# Patient Record
Sex: Female | Born: 1935 | Race: White | Hispanic: No | Smoking: Never smoker
Health system: Southern US, Community
[De-identification: ages and names within clinical notes are randomized; demographics above are authoritative.]

## PROBLEM LIST (undated history)

## (undated) DIAGNOSIS — I10 Essential (primary) hypertension: Secondary | ICD-10-CM

## (undated) DIAGNOSIS — I509 Heart failure, unspecified: Secondary | ICD-10-CM

## (undated) DIAGNOSIS — K219 Gastro-esophageal reflux disease without esophagitis: Secondary | ICD-10-CM

## (undated) DIAGNOSIS — R1310 Dysphagia, unspecified: Secondary | ICD-10-CM

## (undated) DIAGNOSIS — Z8719 Personal history of other diseases of the digestive system: Secondary | ICD-10-CM

## (undated) DIAGNOSIS — E119 Type 2 diabetes mellitus without complications: Secondary | ICD-10-CM

## (undated) DIAGNOSIS — G4730 Sleep apnea, unspecified: Secondary | ICD-10-CM

## (undated) HISTORY — PX: APPENDECTOMY: SHX54

## (undated) HISTORY — PX: CHOLECYSTECTOMY: SHX55

---

## 2005-01-16 ENCOUNTER — Ambulatory Visit: Payer: Self-pay | Admitting: Internal Medicine

## 2005-01-29 ENCOUNTER — Ambulatory Visit: Payer: Self-pay | Admitting: Internal Medicine

## 2005-02-20 ENCOUNTER — Ambulatory Visit: Payer: Self-pay | Admitting: Internal Medicine

## 2005-04-17 ENCOUNTER — Ambulatory Visit: Payer: Self-pay | Admitting: Internal Medicine

## 2006-01-10 ENCOUNTER — Emergency Department: Payer: Self-pay | Admitting: Emergency Medicine

## 2006-01-12 ENCOUNTER — Emergency Department: Payer: Self-pay | Admitting: Emergency Medicine

## 2006-01-15 ENCOUNTER — Emergency Department: Payer: Self-pay | Admitting: Emergency Medicine

## 2006-01-22 ENCOUNTER — Emergency Department: Payer: Self-pay | Admitting: Emergency Medicine

## 2006-03-26 ENCOUNTER — Ambulatory Visit: Payer: Self-pay | Admitting: Internal Medicine

## 2006-07-11 ENCOUNTER — Ambulatory Visit: Payer: Self-pay | Admitting: Internal Medicine

## 2006-12-09 ENCOUNTER — Emergency Department: Payer: Self-pay | Admitting: Emergency Medicine

## 2007-05-15 ENCOUNTER — Ambulatory Visit: Payer: Self-pay | Admitting: Gastroenterology

## 2007-06-18 ENCOUNTER — Ambulatory Visit: Payer: Self-pay | Admitting: Internal Medicine

## 2008-06-21 ENCOUNTER — Ambulatory Visit: Payer: Self-pay | Admitting: Internal Medicine

## 2008-10-06 ENCOUNTER — Ambulatory Visit: Payer: Self-pay

## 2009-07-29 ENCOUNTER — Ambulatory Visit: Payer: Self-pay | Admitting: Internal Medicine

## 2009-10-25 ENCOUNTER — Ambulatory Visit: Payer: Self-pay | Admitting: Internal Medicine

## 2009-11-08 ENCOUNTER — Ambulatory Visit: Payer: Self-pay | Admitting: Internal Medicine

## 2010-04-18 ENCOUNTER — Ambulatory Visit: Payer: Self-pay | Admitting: Ophthalmology

## 2010-04-25 ENCOUNTER — Ambulatory Visit: Payer: Self-pay | Admitting: Ophthalmology

## 2010-08-02 ENCOUNTER — Ambulatory Visit: Payer: Self-pay | Admitting: Internal Medicine

## 2011-06-11 ENCOUNTER — Ambulatory Visit: Payer: Self-pay | Admitting: Ophthalmology

## 2011-06-11 DIAGNOSIS — Z01810 Encounter for preprocedural cardiovascular examination: Secondary | ICD-10-CM

## 2011-06-18 ENCOUNTER — Ambulatory Visit: Payer: Self-pay | Admitting: Ophthalmology

## 2011-10-23 ENCOUNTER — Ambulatory Visit: Payer: Self-pay | Admitting: Internal Medicine

## 2011-10-25 ENCOUNTER — Ambulatory Visit: Payer: Self-pay | Admitting: Internal Medicine

## 2012-05-14 ENCOUNTER — Ambulatory Visit: Payer: Self-pay | Admitting: Ophthalmology

## 2012-05-14 LAB — CREATININE, SERUM
EGFR (African American): >60
EGFR (Non-African Amer.): >60

## 2012-09-02 ENCOUNTER — Ambulatory Visit: Payer: Self-pay | Admitting: Neurosurgery

## 2012-09-02 LAB — CREATININE, SERUM
EGFR (African American): >60
EGFR (Non-African Amer.): >60

## 2012-12-25 ENCOUNTER — Ambulatory Visit: Payer: Self-pay | Admitting: Internal Medicine

## 2012-12-26 ENCOUNTER — Emergency Department: Payer: Self-pay | Admitting: Emergency Medicine

## 2012-12-26 LAB — CBC WITH DIFFERENTIAL/PLATELET
Eosinophil %: 0.5 %
HCT: 41.8 % (ref 35.0–47.0)
Lymphocyte %: 24 %
MCH: 29.8 pg (ref 26.0–34.0)
MCHC: 34.2 g/dL (ref 32.0–36.0)
Monocyte %: 7.8 %
Neutrophil #: 8.3 10*3/uL — ABNORMAL HIGH (ref 1.4–6.5)
Neutrophil %: 67.3 %
RBC: 4.79 10*6/uL (ref 3.80–5.20)
RDW: 14.7 % — ABNORMAL HIGH (ref 11.5–14.5)
WBC: 12.3 10*3/uL — ABNORMAL HIGH (ref 3.6–11.0)

## 2012-12-26 LAB — URINALYSIS, COMPLETE
Bacteria: NONE SEEN
Bilirubin,UR: NEGATIVE
Blood: NEGATIVE
Glucose,UR: NEGATIVE mg/dL (ref 0–75)
Nitrite: NEGATIVE
Protein: NEGATIVE
Specific Gravity: 1.01 (ref 1.003–1.030)
Squamous Epithelial: 1
WBC UR: 58 /HPF (ref 0–5)

## 2012-12-26 LAB — COMPREHENSIVE METABOLIC PANEL
Albumin: 3.3 g/dL — ABNORMAL LOW (ref 3.4–5.0)
Alkaline Phosphatase: 92 U/L (ref 50–136)
Anion Gap: 2 — ABNORMAL LOW (ref 7–16)
Calcium, Total: 8.6 mg/dL (ref 8.5–10.1)
Creatinine: 0.68 mg/dL (ref 0.60–1.30)
EGFR (African American): >60
Glucose: 155 mg/dL — ABNORMAL HIGH (ref 65–99)
Osmolality: 274 (ref 275–301)
Potassium: 4.2 mmol/L (ref 3.5–5.1)
SGOT(AST): 25 U/L (ref 15–37)
Sodium: 135 mmol/L — ABNORMAL LOW (ref 136–145)
Total Protein: 6.9 g/dL (ref 6.4–8.2)

## 2012-12-26 LAB — TROPONIN I: Troponin-I: <0.02 ng/mL

## 2013-12-30 ENCOUNTER — Ambulatory Visit: Payer: Self-pay | Admitting: Internal Medicine

## 2014-09-09 ENCOUNTER — Ambulatory Visit: Payer: Self-pay | Admitting: Internal Medicine

## 2015-01-05 ENCOUNTER — Ambulatory Visit
Admit: 2015-01-05 | Disposition: A | Discharge status: Home / Self Care | Payer: Self-pay | Attending: Internal Medicine | Admitting: Internal Medicine

## 2016-05-30 ENCOUNTER — Other Ambulatory Visit: Payer: Self-pay | Admitting: Internal Medicine

## 2016-05-30 DIAGNOSIS — Z1231 Encounter for screening mammogram for malignant neoplasm of breast: Secondary | ICD-10-CM

## 2016-06-15 ENCOUNTER — Ambulatory Visit: Payer: Self-pay

## 2016-07-05 ENCOUNTER — Ambulatory Visit
Admission: RE | Admit: 2016-07-05 | Discharge: 2016-07-05 | Disposition: A | Discharge status: Home / Self Care | Payer: Medicare Other | Source: Ambulatory Visit | Attending: Internal Medicine | Admitting: Internal Medicine

## 2016-07-05 ENCOUNTER — Other Ambulatory Visit: Payer: Self-pay | Admitting: Internal Medicine

## 2016-07-05 DIAGNOSIS — Z1231 Encounter for screening mammogram for malignant neoplasm of breast: Secondary | ICD-10-CM | POA: Insufficient documentation

## 2016-10-01 HISTORY — PX: CARDIAC VALVE REPLACEMENT: SHX585

## 2017-07-05 ENCOUNTER — Emergency Department: Payer: Medicare Other

## 2017-07-05 ENCOUNTER — Emergency Department
Admission: EM | Admit: 2017-07-05 | Discharge: 2017-07-06 | Disposition: A | Discharge status: Other Acute Inpatient Hospital | Payer: Medicare Other | Attending: Emergency Medicine | Admitting: Emergency Medicine

## 2017-07-05 DIAGNOSIS — R109 Unspecified abdominal pain: Secondary | ICD-10-CM | POA: Diagnosis present

## 2017-07-05 DIAGNOSIS — I110 Hypertensive heart disease with heart failure: Secondary | ICD-10-CM | POA: Diagnosis not present

## 2017-07-05 DIAGNOSIS — R824 Acetonuria: Secondary | ICD-10-CM | POA: Diagnosis not present

## 2017-07-05 DIAGNOSIS — I509 Heart failure, unspecified: Secondary | ICD-10-CM | POA: Insufficient documentation

## 2017-07-05 DIAGNOSIS — R319 Hematuria, unspecified: Secondary | ICD-10-CM | POA: Diagnosis not present

## 2017-07-05 DIAGNOSIS — E119 Type 2 diabetes mellitus without complications: Secondary | ICD-10-CM | POA: Diagnosis not present

## 2017-07-05 DIAGNOSIS — Z79899 Other long term (current) drug therapy: Secondary | ICD-10-CM | POA: Insufficient documentation

## 2017-07-05 DIAGNOSIS — K449 Diaphragmatic hernia without obstruction or gangrene: Secondary | ICD-10-CM | POA: Diagnosis not present

## 2017-07-05 DIAGNOSIS — Z7982 Long term (current) use of aspirin: Secondary | ICD-10-CM | POA: Insufficient documentation

## 2017-07-05 DIAGNOSIS — Z7984 Long term (current) use of oral hypoglycemic drugs: Secondary | ICD-10-CM | POA: Insufficient documentation

## 2017-07-05 DIAGNOSIS — K311 Adult hypertrophic pyloric stenosis: Secondary | ICD-10-CM | POA: Diagnosis not present

## 2017-07-05 HISTORY — DX: Gastro-esophageal reflux disease without esophagitis: K21.9

## 2017-07-05 HISTORY — DX: Essential (primary) hypertension: I10

## 2017-07-05 HISTORY — DX: Type 2 diabetes mellitus without complications: E11.9

## 2017-07-05 HISTORY — DX: Dysphagia, unspecified: R13.10

## 2017-07-05 HISTORY — DX: Heart failure, unspecified: I50.9

## 2017-07-05 LAB — URINALYSIS, COMPLETE (UACMP) WITH MICROSCOPIC
Bilirubin Urine: NEGATIVE
Glucose, UA: NEGATIVE mg/dL
HGB URINE DIPSTICK: NEGATIVE
Ketones, ur: 20 mg/dL — AB
NITRITE: NEGATIVE
Protein, ur: 100 mg/dL — AB
SPECIFIC GRAVITY, URINE: 1.029 (ref 1.005–1.030)
pH: 5 (ref 5.0–8.0)

## 2017-07-05 LAB — BASIC METABOLIC PANEL
ANION GAP: 11 (ref 5–15)
BUN: 21 mg/dL — ABNORMAL HIGH (ref 6–20)
CALCIUM: 9.2 mg/dL (ref 8.9–10.3)
CO2: 25 mmol/L (ref 22–32)
Chloride: 101 mmol/L (ref 101–111)
Creatinine, Ser: 0.68 mg/dL (ref 0.44–1.00)
Glucose, Bld: 191 mg/dL — ABNORMAL HIGH (ref 65–99)
Potassium: 4.6 mmol/L (ref 3.5–5.1)
SODIUM: 137 mmol/L (ref 135–145)

## 2017-07-05 LAB — CBC
HCT: 40.7 % (ref 35.0–47.0)
HEMOGLOBIN: 13.6 g/dL (ref 12.0–16.0)
MCH: 28.7 pg (ref 26.0–34.0)
MCHC: 33.5 g/dL (ref 32.0–36.0)
MCV: 85.7 fL (ref 80.0–100.0)
Platelets: 258 10*3/uL (ref 150–440)
RBC: 4.75 MIL/uL (ref 3.80–5.20)
RDW: 15.2 % — ABNORMAL HIGH (ref 11.5–14.5)
WBC: 12 10*3/uL — ABNORMAL HIGH (ref 3.6–11.0)

## 2017-07-05 LAB — LIPASE, BLOOD: LIPASE: 33 U/L (ref 11–51)

## 2017-07-05 LAB — TROPONIN I

## 2017-07-05 NOTE — ED Triage Notes (Signed)
Pt to triage via wheelchair. Pt reports abd pain and chest pain that started around 4 pm. Pt has +nausea and is spitting up but has not vomited. +shortness of breath but denies diaphoresis. Pt reports she ate lunch out and thinks she could have food poison.

## 2017-07-06 ENCOUNTER — Encounter: Payer: Self-pay | Admitting: Emergency Medicine

## 2017-07-06 ENCOUNTER — Emergency Department: Payer: Medicare Other

## 2017-07-06 LAB — HEPATIC FUNCTION PANEL
ALBUMIN: 3.6 g/dL (ref 3.5–5.0)
ALT: 20 U/L (ref 14–54)
AST: 25 U/L (ref 15–41)
Alkaline Phosphatase: 65 U/L (ref 38–126)
Bilirubin, Direct: 0.2 mg/dL (ref 0.1–0.5)
Indirect Bilirubin: 1 mg/dL — ABNORMAL HIGH (ref 0.3–0.9)
TOTAL PROTEIN: 6.6 g/dL (ref 6.5–8.1)
Total Bilirubin: 1.2 mg/dL (ref 0.3–1.2)

## 2017-07-06 LAB — LACTIC ACID, PLASMA: Lactic Acid, Venous: 1.3 mmol/L (ref 0.5–1.9)

## 2017-07-06 LAB — BRAIN NATRIURETIC PEPTIDE: B NATRIURETIC PEPTIDE 5: 102 pg/mL — AB (ref 0.0–100.0)

## 2017-07-06 MED ORDER — SODIUM CHLORIDE 0.9 % IV BOLUS (SEPSIS)
500.0000 mL | INTRAVENOUS | Status: AC
  Administered 2017-07-06: 500 mL via INTRAVENOUS

## 2017-07-06 MED ORDER — MORPHINE SULFATE (PF) 2 MG/ML IV SOLN
2.0000 mg | Freq: Once | INTRAVENOUS | Status: AC
  Administered 2017-07-06: 2 mg via INTRAVENOUS
  Filled 2017-07-06: qty 1

## 2017-07-06 NOTE — ED Notes (Signed)
Pt transported to CT ?

## 2017-07-06 NOTE — ED Notes (Signed)
emtala reviewed by this RN 

## 2017-07-06 NOTE — ED Notes (Signed)
Pt given crackers and diet coke.   

## 2017-07-06 NOTE — ED Notes (Signed)
Pt spit up some crackers. MD Karma Greaser made aware.

## 2017-07-06 NOTE — Discharge Instructions (Signed)
As we discussed, we believe your symptoms are caused by a very large hiatal hernia, where the majority of your stomach is actually up in your chest.  We discussed sending you to [facility] for further evaluation, but because this is a process that has probably been going on for some time and because you are stable and able to eat and drink, your preference is to go home and follow-up as an outpatient.  We think that is appropriate, but strongly encourage you to follow-up with [facility] (we provided contact information) for an appointment with an Upper GI specialist at the next available opportunity.  Please continue your regular medications.  There is a question about whether or not you have an infection in your urine, but since you have been on an antibiotic and are not having any specific symptoms, we are holding off on giving you additional antibiotics.  A urine culture is being processed in the lab, though, and you will be contacted to let you know that you need additional antibiotics.

## 2017-07-06 NOTE — ED Provider Notes (Signed)
[facility] Emergency Department Provider Note  ____________________________________________   First MD Initiated Contact with Patient 07/06/17 [phone]     (approximate)  I have reviewed the triage vital signs and the nursing notes.   HISTORY  Chief Complaint Abdominal Pain and Chest Pain    HPI Kayla Gomez is a 83 y.o. female who presents in the company of her daughters tonight for evaluation of lower abdominal pain as well as some upper abdominal pain.She denies chest pain per se but the pain seems to be at the top of her abdomen as well as the suprapubic region.  She reports some shortness of breath as well.  She has had nausea and some "spitting up", but denies vomiting.  She has been eating and drinking less than usual recently.  Symptoms became acutely worse today, but she reports that she has not been feeling well for several days.  She was seen by her primary care doctor about a week ago and started on Keflex for possible left lower leg cellulitis and she has been on 10 days of cephalexin.  She is also treated with a PPI for dysphagia and acid reflux.  She has choked occasionally on the "stuff" that has been coming up into her mouth particularly at night.  She denies fever/chills, vomiting, and dysuria.  Today her symptoms are moderate.  Nothing in particular makes the patient's symptoms better nor worse.      Past Medical History:  Diagnosis Date  . Acid reflux   . CHF (congestive heart failure) ([place])   . Diabetes mellitus without complication ([place])   . Dysphagia   . Hypertension     There are no active problems to display for this patient.   Past Surgical History:  Procedure Laterality Date  . APPENDECTOMY    . CHOLECYSTECTOMY      Prior to Admission medications   Medication Sig Start Date End Date Taking? Authorizing Provider  aspirin 81 MG EC tablet Take 81 mg by mouth daily.   Yes [provider]  cephALEXin (KEFLEX) 500 MG  capsule Take 500 mg by mouth 3 (three) times daily. 06/27/17 07/07/17 Yes [provider]  glipiZIDE (GLUCOTROL XL) 2.5 MG 24 hr tablet Take 2.5 mg by mouth daily.   Yes [provider]  lisinopril (PRINIVIL,ZESTRIL) 10 MG tablet Take 10 mg by mouth daily.   Yes [provider]  omeprazole (PRILOSEC) 20 MG capsule Take 20 mg by mouth daily.   Yes [provider]  oxybutynin (DITROPAN-XL) 10 MG 24 hr tablet Take 10 mg by mouth daily.   Yes [provider]    Allergies Patient has no known allergies.  History reviewed. No pertinent family history.  Social History Social History  Substance Use Topics  . Smoking status: Never Smoker  . Smokeless tobacco: Never Used  . Alcohol use No    Review of Systems Constitutional: No fever/chills Eyes: No visual changes. ENT: No sore throat. Cardiovascular: Denies chest pain. Respiratory: Recent shortness of breath and cough Gastrointestinal: Upper and lower abdominal pain with nausea but no vomiting, no diarrhea Genitourinary: Negative for dysuria. Musculoskeletal: Negative for neck pain.  Negative for back pain. Integumentary: Negative for rash. Neurological: Negative for headaches, focal weakness or numbness.   ____________________________________________   PHYSICAL EXAM:  VITAL SIGNS: ED Triage Vitals  Enc Vitals Group     BP 07/05/17 2129 (!) 126/108     Pulse Rate 07/05/17 2129 (!) 110  Resp 07/05/17 2129 18     Temp 07/05/17 2129 98.1 F (36.7 C)     Temp Source 07/05/17 2129 Axillary     SpO2 07/05/17 2129 97 %     Weight 07/05/17 2130 71.7 kg (158 lb)     Height 07/05/17 2130 1.575 m (5\' 2" )     Head Circumference --      Peak Flow --      Pain Score 07/05/17 2152 7     Pain Loc --      Pain Edu? --      Excl. in [place]? --     Constitutional: Alert and oriented. Elderly but generally well-appearing and in no acute distress but does appear somewhat uncomfortable Eyes:  Conjunctivae are normal.  Head: Atraumatic. Nose: No congestion/rhinnorhea. Mouth/Throat: Mucous membranes are moist. Neck: No stridor.  No meningeal signs.   Cardiovascular: Initially some mild tachycardia then a normal rate in the 80s, regular rhythm. Good peripheral circulation. Grossly normal heart sounds. Respiratory: Slightly increased respiratory effort.  No retractions.  Some mild coarse breath sounds in the bases of the lungs.  Occasional cough. Gastrointestinal: Soft with no upper abdominal tenderness and with negative Murphy sign but tenderness to palpation of the suprapubic region Musculoskeletal: No gross deformities of extremities. Neurologic:  Normal speech and language. No gross focal neurologic deficits are appreciated.  Skin:  Skin is warm, dry and intact. No rash noted.  There is some chronic skin changes but no evidence of acute cellulitis in the left lower extremity at this time Psychiatric: Mood and affect are normal. Speech and behavior are normal.  ____________________________________________   LABS (all labs ordered are listed, but only abnormal results are displayed)  Labs Reviewed  BASIC METABOLIC PANEL - Abnormal; Notable for the following:       Result Value   Glucose, Bld 191 (*)    BUN 21 (*)    All other components within normal limits  CBC - Abnormal; Notable for the following:    WBC 12.0 (*)    RDW 15.2 (*)    All other components within normal limits  URINALYSIS, COMPLETE (UACMP) WITH MICROSCOPIC - Abnormal; Notable for the following:    Color, Urine AMBER (*)    APPearance CLOUDY (*)    Ketones, ur 20 (*)    Protein, ur 100 (*)    Leukocytes, UA LARGE (*)    Bacteria, UA RARE (*)    Squamous Epithelial / LPF 6-30 (*)    All other components within normal limits  BRAIN NATRIURETIC PEPTIDE - Abnormal; Notable for the following:    B Natriuretic Peptide 102.0 (*)    All other components within normal limits  HEPATIC FUNCTION PANEL - Abnormal;  Notable for the following:    Indirect Bilirubin 1.0 (*)    All other components within normal limits  URINE CULTURE  TROPONIN I  LIPASE, BLOOD  LACTIC ACID, PLASMA   ____________________________________________  EKG  ED ECG REPORT I, Mearl Harewood, the attending physician, personally viewed and interpreted this ECG.  Date: 07/05/2017 EKG Time: 21:58 Rate: 100 Rhythm: normal sinus rhythm QRS Axis: normal Intervals: normal ST/T Wave abnormalities: Non-specific ST segment / T-wave changes, but no evidence of acute ischemia. Narrative Interpretation: no evidence of acute ischemia  ____________________________________________  RADIOLOGY   Ct Abdomen Pelvis Wo Contrast  Result Date: 07/06/2017 CLINICAL DATA:  Abdominal pain and chest pain starting at 4 p.m. Nausea. Shortness of breath. Possible food poisoning. EXAM: CT CHEST,  ABDOMEN AND PELVIS WITHOUT CONTRAST TECHNIQUE: Multidetector CT imaging of the chest, abdomen and pelvis was performed following the standard protocol without IV contrast. COMPARISON:  None. FINDINGS: CT CHEST FINDINGS Cardiovascular: Mild cardiac enlargement. No pericardial effusion. Normal caliber thoracic aorta. Calcification of the aorta and coronary arteries. Mediastinum/Nodes: There is a large esophageal hiatal hernia with most of the stomach in the chest. Esophagus is dilated with an air-fluid level. This may represent reflux or dysmotility. A distal obstructing process is not excluded. No significant lymphadenopathy in the chest. Lungs/Pleura: Atelectasis in the lung bases, greater on the left. Mild peripheral fibrosis. No consolidation or airspace disease. No pleural effusions. No pneumothorax. Musculoskeletal: Degenerative changes throughout the thoracic spine. No destructive bone lesions appreciated. Old rib fractures. CT ABDOMEN PELVIS FINDINGS Hepatobiliary: No focal liver abnormality is seen. Status post cholecystectomy. No biliary dilatation. Pancreas:  Unremarkable. No pancreatic ductal dilatation or surrounding inflammatory changes. Spleen: Normal in size without focal abnormality. Calcification in the splenic hilum may represent a small splenic artery aneurysm. Adrenals/Urinary Tract: No adrenal gland nodules. Kidneys are symmetrical in size. No hydronephrosis or hydroureter. No renal, ureteral, or bladder stones. No apparent bladder wall thickening. Stomach/Bowel: Small bowel are decompressed. No evidence of obstruction but difficult to evaluate the small bowel wall. Scattered stool in the colon. No colonic distention or inflammatory changes appreciated. Appendix is not identified. Vascular/Lymphatic: Aortic atherosclerosis. No enlarged abdominal or pelvic lymph nodes. Reproductive: Status post hysterectomy. No adnexal masses. Other: No free air or free fluid in the abdomen. Abdominal wall musculature is atrophic but appears intact. Edema in the subcutaneous fat. Musculoskeletal: Diffuse degenerative changes and mild scoliosis of the lumbar spine. Degenerative changes of the hips. No destructive bone lesions IMPRESSION: 1. Large esophageal hiatal hernia with most of the stomach in the chest. 2. Esophageal dilatation with air-fluid level. This could represent dysmotility, reflux, achalasia, or stricture. 3. No focal consolidation in the lungs. Chronic fibrosis and atelectatic changes. 4. No acute process demonstrated in the abdomen or pelvis. 5. Aortic atherosclerosis. Electronically Signed   By: Lucienne Capers M.D.   On: 07/06/2017 02:47   Dg Chest 2 View  Result Date: 07/05/2017 CLINICAL DATA:  83 y/o  F; chest pain and abdominal pain. EXAM: CHEST  2 VIEW COMPARISON:  12/26/2012 chest radiograph FINDINGS: Stable cardiomegaly. Aortic atherosclerosis with calcification. Pulmonary venous hypertension. Large hiatal hernia. Ill-defined left basilar opacity is probably atelectasis. Small bilateral pleural effusions. No pneumothorax. No acute osseous  abnormality is evident. IMPRESSION: 1. Stable cardiomegaly.  Pulmonary venous hypertension. 2. Large hiatal hernia. 3. Aortic atherosclerosis. 4. Left basilar opacity is probably atelectasis. 5. Small bilateral pleural effusions. Electronically Signed   By: Kristine Garbe M.D.   On: 07/05/2017 22:33   Ct Chest Wo Contrast  Result Date: 07/06/2017 CLINICAL DATA:  Abdominal pain and chest pain starting at 4 p.m. Nausea. Shortness of breath. Possible food poisoning. EXAM: CT CHEST, ABDOMEN AND PELVIS WITHOUT CONTRAST TECHNIQUE: Multidetector CT imaging of the chest, abdomen and pelvis was performed following the standard protocol without IV contrast. COMPARISON:  None. FINDINGS: CT CHEST FINDINGS Cardiovascular: Mild cardiac enlargement. No pericardial effusion. Normal caliber thoracic aorta. Calcification of the aorta and coronary arteries. Mediastinum/Nodes: There is a large esophageal hiatal hernia with most of the stomach in the chest. Esophagus is dilated with an air-fluid level. This may represent reflux or dysmotility. A distal obstructing process is not excluded. No significant lymphadenopathy in the chest. Lungs/Pleura: Atelectasis in the lung bases, greater on the  left. Mild peripheral fibrosis. No consolidation or airspace disease. No pleural effusions. No pneumothorax. Musculoskeletal: Degenerative changes throughout the thoracic spine. No destructive bone lesions appreciated. Old rib fractures. CT ABDOMEN PELVIS FINDINGS Hepatobiliary: No focal liver abnormality is seen. Status post cholecystectomy. No biliary dilatation. Pancreas: Unremarkable. No pancreatic ductal dilatation or surrounding inflammatory changes. Spleen: Normal in size without focal abnormality. Calcification in the splenic hilum may represent a small splenic artery aneurysm. Adrenals/Urinary Tract: No adrenal gland nodules. Kidneys are symmetrical in size. No hydronephrosis or hydroureter. No renal, ureteral, or bladder  stones. No apparent bladder wall thickening. Stomach/Bowel: Small bowel are decompressed. No evidence of obstruction but difficult to evaluate the small bowel wall. Scattered stool in the colon. No colonic distention or inflammatory changes appreciated. Appendix is not identified. Vascular/Lymphatic: Aortic atherosclerosis. No enlarged abdominal or pelvic lymph nodes. Reproductive: Status post hysterectomy. No adnexal masses. Other: No free air or free fluid in the abdomen. Abdominal wall musculature is atrophic but appears intact. Edema in the subcutaneous fat. Musculoskeletal: Diffuse degenerative changes and mild scoliosis of the lumbar spine. Degenerative changes of the hips. No destructive bone lesions IMPRESSION: 1. Large esophageal hiatal hernia with most of the stomach in the chest. 2. Esophageal dilatation with air-fluid level. This could represent dysmotility, reflux, achalasia, or stricture. 3. No focal consolidation in the lungs. Chronic fibrosis and atelectatic changes. 4. No acute process demonstrated in the abdomen or pelvis. 5. Aortic atherosclerosis. Electronically Signed   By: Lucienne Capers M.D.   On: 07/06/2017 02:47    ____________________________________________   PROCEDURES  Critical Care performed: No   Procedure(s) performed:   Procedures   ____________________________________________   INITIAL IMPRESSION / ASSESSMENT AND PLAN / ED COURSE  As part of my medical decision making, I reviewed the following data within the [place] History obtained from family, Nursing notes reviewed and incorporated, [facility]  , Old chart reviewed and Radiograph reviewed and notable for questionable basilar atelectasis.    Differential diagnosis includes, but is not limited to, ovarian cyst, ovarian torsion, acute appendicitis, diverticulitis, urinary tract infection/pyelonephritis, endometriosis, bowel obstruction, colitis,  renal colic, gastroenteritis, hernia, etc.  however based on the fact that her urine does appear grossly infected and I think this is most likely a UTI.  It is concerning, however, that she has been taking Keflex and still has what appears to be a UTI with hematuria.  Additionally she has some atelectasis in the base of her long that could represent a pneumonia rather than atelectasis and she is reporting shortness of breath and has been coughing.  Given the constellation of symptoms and her age I will obtain a noncontrast CT scans of her chest to rule out interstitial pneumonia to the best of my ability as well as of her abdomen and pelvis to evaluate for the possibility of an obstructive process or ileus as well as kidney stones and other acute intra-abdominal pathology.  She has ketones in her urine and I am giving her a small fluid bolus.  She has a very mild leukocytosis of 12 but this is nonspecific and likely the result of her urinary tract infection.  Family agrees with the current plan.  I will hold off on antibiotics until I get the results of her scans because at this point I am not sure what I am treating, pneumonia and/or UTI that is apparently resistant to Keflex.  She is afebrile.   Clinical Course as of  Jul 07 755  Sat Jul 06, 2017  0209 Lactic acid WNL Lactic Acid, Venous: 1.3 [CF]  0347 Spoke with Dr. Dahlia Byes  [CF]  [phone] The patient tried a by mouth challenge but spit back up for crackers and was able to keep down some diet soda for sure procedure time but then spit that back up as well.  I do not think that she is going to be able to tolerate going home at this point.  I will contact [facility] as per my discussion with the patient and her daughters to discuss whether or not they would be able to see and evaluate her in the emergency department.  The patient and daughters understand.  [CF]  [id number] I spoke by phone with Dr. Pincus Badder at [facility] emergency Department who agreed to the transfer after we  discussed the case.  I updated the patient and family.  I will have normal saline infusion going until it is time for transport by [facility].  The patient and daughters understand that she may not be admitted and may not require surgery, but this way she will have an evaluation by a specialist.  [CF]  [phone] EMS on the way for transport.  Reassessed patient, she is stable but with worsening pain.  Will give small dose of morphine prior to transport.  Updated EMTALA documentation.    [CF]    Clinical Course User Index [CF] Hinda Kehr, MD    ____________________________________________  FINAL CLINICAL IMPRESSION(S) / ED DIAGNOSES  Final diagnoses:  Hiatal hernia  Hematuria, unspecified type  Ketonuria  Gastric outlet obstruction     MEDICATIONS GIVEN DURING THIS VISIT:  Medications  sodium chloride 0.9 % bolus 500 mL (0 mLs Intravenous Stopped 07/06/17 0210)  morphine 2 MG/ML injection 2 mg (2 mg Intravenous Given 07/06/17 0724)     NEW OUTPATIENT MEDICATIONS STARTED DURING THIS VISIT:  New Prescriptions   No medications on file    Modified Medications   No medications on file    Discontinued Medications   No medications on file     Note:  This document was prepared using Dragon voice recognition software and may include unintentional dictation errors.    Hinda Kehr, MD 07/06/17 [phone]

## 2017-07-07 LAB — URINE CULTURE: SPECIAL REQUESTS: NORMAL

## 2017-12-26 ENCOUNTER — Other Ambulatory Visit: Payer: Self-pay | Admitting: Internal Medicine

## 2017-12-26 DIAGNOSIS — Z1231 Encounter for screening mammogram for malignant neoplasm of breast: Secondary | ICD-10-CM

## 2018-01-17 ENCOUNTER — Ambulatory Visit
Admission: RE | Admit: 2018-01-17 | Discharge: 2018-01-17 | Disposition: A | Discharge status: Home / Self Care | Payer: Medicare Other | Source: Ambulatory Visit | Attending: Internal Medicine | Admitting: Internal Medicine

## 2018-01-17 DIAGNOSIS — Z1231 Encounter for screening mammogram for malignant neoplasm of breast: Secondary | ICD-10-CM | POA: Diagnosis not present

## 2018-01-20 ENCOUNTER — Other Ambulatory Visit: Payer: Self-pay

## 2018-01-20 ENCOUNTER — Encounter: Payer: Self-pay | Admitting: Emergency Medicine

## 2018-01-20 ENCOUNTER — Emergency Department
Admission: EM | Admit: 2018-01-20 | Discharge: 2018-01-20 | Disposition: A | Discharge status: Home / Self Care | Payer: Medicare Other | Attending: Emergency Medicine | Admitting: Emergency Medicine

## 2018-01-20 ENCOUNTER — Emergency Department: Payer: Medicare Other

## 2018-01-20 DIAGNOSIS — I509 Heart failure, unspecified: Secondary | ICD-10-CM | POA: Insufficient documentation

## 2018-01-20 DIAGNOSIS — Z7982 Long term (current) use of aspirin: Secondary | ICD-10-CM | POA: Diagnosis not present

## 2018-01-20 DIAGNOSIS — Z7984 Long term (current) use of oral hypoglycemic drugs: Secondary | ICD-10-CM | POA: Diagnosis not present

## 2018-01-20 DIAGNOSIS — S6991XA Unspecified injury of right wrist, hand and finger(s), initial encounter: Secondary | ICD-10-CM | POA: Diagnosis present

## 2018-01-20 DIAGNOSIS — Y9301 Activity, walking, marching and hiking: Secondary | ICD-10-CM | POA: Insufficient documentation

## 2018-01-20 DIAGNOSIS — Y999 Unspecified external cause status: Secondary | ICD-10-CM | POA: Insufficient documentation

## 2018-01-20 DIAGNOSIS — I110 Hypertensive heart disease with heart failure: Secondary | ICD-10-CM | POA: Insufficient documentation

## 2018-01-20 DIAGNOSIS — Z79899 Other long term (current) drug therapy: Secondary | ICD-10-CM | POA: Insufficient documentation

## 2018-01-20 DIAGNOSIS — E119 Type 2 diabetes mellitus without complications: Secondary | ICD-10-CM | POA: Diagnosis not present

## 2018-01-20 DIAGNOSIS — S62101A Fracture of unspecified carpal bone, right wrist, initial encounter for closed fracture: Secondary | ICD-10-CM | POA: Insufficient documentation

## 2018-01-20 DIAGNOSIS — S00211A Abrasion of right eyelid and periocular area, initial encounter: Secondary | ICD-10-CM | POA: Diagnosis not present

## 2018-01-20 DIAGNOSIS — S0990XA Unspecified injury of head, initial encounter: Secondary | ICD-10-CM | POA: Insufficient documentation

## 2018-01-20 DIAGNOSIS — Y929 Unspecified place or not applicable: Secondary | ICD-10-CM | POA: Diagnosis not present

## 2018-01-20 DIAGNOSIS — W010XXA Fall on same level from slipping, tripping and stumbling without subsequent striking against object, initial encounter: Secondary | ICD-10-CM | POA: Insufficient documentation

## 2018-01-20 MED ORDER — ACETAMINOPHEN 325 MG PO TABS
650.0000 mg | ORAL_TABLET | Freq: Once | ORAL | Status: AC
  Administered 2018-01-20: 650 mg via ORAL
  Filled 2018-01-20: qty 2

## 2018-01-20 NOTE — Care Management Note (Signed)
Case Management Note  Patient Details  Name: Kayla Gomez MRN: [id number] Date of Birth: [birth year]  Subjective/Objective:  Received referral from ED CSW that patient was in need of HHPT. Patient admitted with a fall,closed fracture of right wrist.  Spoke with patients daughter who was a bedside, Gustavus Bryant [phone]). She states patient lives with another daughter, granddaughter, and other family. They work during the day which leaves patient alone Prior to admission to the ED patient was independent with her walker. She did no drive. PCP is Dr. Ginette Pitman. Last seen 4 month ago. Discussed home health with daughter. She is agreeable. RNCM feels patient would benefit from RN, PT, OT and HHA. Offered choice of home health agencies. Referral to [facility] with [facility]. Patient will need a BSC. Ordered from [facility]. Corene Cornea will take to ED.  Alternate cell number for patient is [phone], provided to [facility] with [facility].                    Action/Plan:   Expected Discharge Date:                  Expected Discharge Plan:  [place]  In-House Referral:     Discharge planning Services  CM Consult  Post Acute Care Choice:  Durable Medical Equipment, Home Health Choice offered to:  Adult Children  DME Arranged:  Bedside commode DME Agency:  [place] Arranged:  PT, OT, Nurse's Aide [place] Agency:  [place]  Status of Service:  Completed, signed off  If discussed at [place] of Stay Meetings, dates discussed:    Additional Comments:  Jolly Mango, RN 01/20/2018, 9:47 AM

## 2018-01-20 NOTE — ED Provider Notes (Signed)
[facility] Provider Note   ____________________________________________   First MD Initiated Contact with Patient 01/20/18 [phone]     (approximate)  I have reviewed the triage vital signs and the nursing notes.   HISTORY  Chief Complaint No chief complaint on file.  Chief complaint is fall  HPI Kayla Gomez is a 84 y.o. female patient fell on way back from bathroom.  No known loss of consciousness.  Patient complains of pain in the mid humerus area and in the wrist.  Patient has full range of motion of the elbow says her shoulder does not hurt much when she moves it and it does not hurt at all if she does not move it she has a little bit of pain in the wrist if she pronates and supinates the hand but only very mild amount.  Patient is full range of motion of the fingers.  Is no active bleeding at present.  Patient had a cut on the back of her upper arm and on the forearm.   Past Medical History:  Diagnosis Date  . Acid reflux   . CHF (congestive heart failure) ([place])   . Diabetes mellitus without complication ([place])   . Dysphagia   . Hypertension     There are no active problems to display for this patient.   Past Surgical History:  Procedure Laterality Date  . APPENDECTOMY    . CARDIAC VALVE REPLACEMENT  2018  . CHOLECYSTECTOMY      Prior to Admission medications   Medication Sig Start Date End Date Taking? Authorizing Provider  aspirin 81 MG EC tablet Take 81 mg by mouth daily.    [provider]  glipiZIDE (GLUCOTROL XL) 2.5 MG 24 hr tablet Take 2.5 mg by mouth daily.    [provider]  lisinopril (PRINIVIL,ZESTRIL) 10 MG tablet Take 10 mg by mouth daily.    [provider]  omeprazole (PRILOSEC) 20 MG capsule Take 20 mg by mouth daily.    [provider]  oxybutynin (DITROPAN-XL) 10 MG 24 hr tablet Take 10 mg by mouth daily.    [provider]    Allergies Patient has no  known allergies.  No family history on file.  Social History Social History   Tobacco Use  . Smoking status: Never Smoker  . Smokeless tobacco: Never Used  Substance Use Topics  . Alcohol use: No  . Drug use: No    Review of Systems  Constitutional: No fever/chills Eyes: No visual changes. ENT: No sore throat. Cardiovascular: Denies chest pain. Respiratory: Denies shortness of breath. Gastrointestinal: No abdominal pain.  No nausea, no vomiting.  No diarrhea.  No constipation. Genitourinary: Negative for dysuria. Musculoskeletal: Negative for back pain. Skin: Negative for rash. Neurological: Negative for headaches, focal weakness  Allergic/Immunilogical: **}  ____________________________________________   PHYSICAL EXAM:  VITAL SIGNS: ED Triage Vitals  Enc Vitals Group     BP 01/20/18 0328 (!) 153/74     Pulse Rate 01/20/18 0328 77     Resp 01/20/18 0328 18     Temp 01/20/18 0328 98.5 F (36.9 C)     Temp Source 01/20/18 0328 Oral     SpO2 01/20/18 0328 98 %     Weight 01/20/18 0330 130 lb (59 kg)     Height 01/20/18 0330 4\' 10"  (1.473 m)     Head Circumference --      Peak Flow --      Pain Score  01/20/18 0329 5     Pain Loc --      Pain Edu? --      Excl. in [place]? --     Constitutional: Alert and oriented. Well appearing and in no acute distress. Eyes: Conjunctivae are normal.  Head: Atraumatic separate small superficial cut about a centimeter long just above the lateral to the right eye. Nose: No congestion/rhinnorhea. Mouth/Throat: Mucous membranes are moist.  Oropharynx non-erythematous. Neck: No stridor no neck pain Cardiovascular: Normal rate, regular rhythm. Grossly normal heart sounds.  Good peripheral circulation. Respiratory: Normal respiratory effort.  No retractions. Lungs CTAB. Gastrointestinal: Soft and nontender. No distention. No abdominal bruits. No CVA tenderness. Musculoskeletal: No lower extremity tenderness nor edema.   Neurologic:   Normal speech and language. No gross focal neurologic deficits are appreciated. Skin:  Skin is warm, dry and intact. No rash noted she does have the 2 cuts I mentioned on the arm and bruise which is fairly firm on the right mid humerus.  It is not pulsatile as marked margins with the pen.  Approximately 6 x 4 cm. Psychiatric: Mood and affect are normal. Speech and behavior are normal.  ____________________________________________   LABS (all labs ordered are listed, but only abnormal results are displayed)  Labs Reviewed - No data to display ____________________________________________  EKG   ____________________________________________  RADIOLOGY  ED MD interpretation: Wrist and shoulder films were reviewed by me I agree with radiologist.  There is a nondisplaced possibly impacted fracture of the wrist.  Official radiology report(s): Dg Wrist Complete Right  Result Date: 01/20/2018 CLINICAL DATA:  Status post fall, with right wrist pain. Initial encounter. EXAM: RIGHT WRIST - COMPLETE 3+ VIEW COMPARISON:  None. FINDINGS: There is a mildly dorsally displaced fracture of the distal radial metaphysis. The distal ulna appears grossly intact. The carpal rows appear grossly intact, and demonstrate normal alignment. Degenerative change is noted at the first carpometacarpal joint, with mild bony remodeling. Soft tissue swelling is noted about the wrist. IMPRESSION: Mildly dorsally displaced fracture of the distal radial metaphysis. Electronically Signed   By: Garald Balding M.D.   On: 01/20/2018 04:22   Ct Head Wo Contrast  Result Date: 01/20/2018 CLINICAL DATA:  Status post fall, with right-sided facial abrasion. Concern for head injury. Ataxia. EXAM: CT HEAD WITHOUT CONTRAST TECHNIQUE: Contiguous axial images were obtained from the base of the skull through the vertex without intravenous contrast. COMPARISON:  CT of the head performed 12/26/2012, and MRI of the brain performed 09/09/2014  FINDINGS: Brain: No evidence of acute infarction, hemorrhage, hydrocephalus or extra-axial collection. The patient's inferior frontal parafalcine meningioma appears to have increased mildly in size from 2015, measuring approximately 3.0 cm (increased from 2.7 cm), with mildly increased right frontal lobe edema. Prominence of the ventricles and sulci reflects mild cortical volume loss. The posterior fossa, including the cerebellum, brainstem and fourth ventricle, is within normal limits. The third and lateral ventricles, and basal ganglia are unremarkable in appearance. The cerebral hemispheres are symmetric in appearance, with normal gray-white differentiation. No mass effect or midline shift is seen. Vascular: No hyperdense vessel or unexpected calcification. Skull: There is no evidence of fracture; visualized osseous structures are unremarkable in appearance. Sinuses/Orbits: The visualized portions of the orbits are within normal limits. The paranasal sinuses and mastoid air cells are well-aerated. Other: No significant soft tissue abnormalities are seen. IMPRESSION: 1. No evidence of traumatic intracranial injury or fracture. 2. Mild cortical volume loss. 3. Inferior frontal parafalcine meningioma appears to  have increased mildly in size from 2015, measuring approximately 3.0 cm, with mildly increased right frontal lobe edema. Electronically Signed   By: Garald Balding M.D.   On: 01/20/2018 04:27   Dg Humerus Right  Result Date: 01/20/2018 CLINICAL DATA:  Status post fall, with right arm pain and hematoma. EXAM: RIGHT HUMERUS - 2+ VIEW COMPARISON:  Chest radiograph performed 07/05/2017 FINDINGS: There appears to be increased superior subluxation of the humeral head from 2018. Would correlate with the patient's symptoms. There is no evidence of fracture. Prominent soft tissue swelling is noted about the right arm. The visualized portions of the right lung appear clear. IMPRESSION: 1. Apparent increased  superior subluxation of the humeral head from 2018. Would correlate with the patient's symptoms as to whether this is chronic. 2. No evidence of fracture. Electronically Signed   By: Garald Balding M.D.   On: 01/20/2018 04:21    ____________________________________________   PROCEDURES  Procedure(s) performed:   Procedures  Critical Care performed:   ____________________________________________   INITIAL IMPRESSION / ASSESSMENT AND PLAN / ED COURSE Patient uses a walker to walk.  She is somewhat unsteady chronically.  She also says she is feeling weak.  I will attempt to get social worker to see if they can get home health to help her around the house as we when she lives with has to work and she would be alone for part of the day.  We will have her follow-up with orthopedics for the wrist fracture in the shoulder subluxation and also with neurosurgery for the meningioma.  I discussed the meningioma with Dr. Lacinda Axon on call here for neurosurgery.  It does not look like there is much that we can do about this at the present time and does not look like it contributed to the fall.          ____________________________________________   FINAL CLINICAL IMPRESSION(S) / ED DIAGNOSES  Final diagnoses:  Closed fracture of right wrist, initial encounter     ED Discharge Orders    None       Note:  This document was prepared using Dragon voice recognition software and may include unintentional dictation errors.    Nena Polio, MD 01/20/18 [phone]

## 2018-01-20 NOTE — Discharge Instructions (Addendum)
Please keep your arm up as much as you can.  You can put ice on the bruise on your upper arm 20 minutes every hour for a day or 2.  Wear the splint on your wrist.  Keep that elevated as much as you can as well.  Tylenol for the pain.  He can put ice on that as well if you wish.  Same thing 20 minutes every hour or 2.  Be careful not to leave the ice on for too long if you get frostbite that will make everything worse.  Please call Dr. Sabra Heck the orthopedic surgeon for follow-up.  He can see you sometime later this week and will probably put a cast on your wrist.  I spoke to the neurosurgeon about the meningioma the benign tumor that you have in your head.  He can follow you up also.  It looks like it has been there for a while.  It is very slow-growing and should not really cause any problems but he will talk to you about that.  I have ordered a bedside commode which should help out getting to the bathroom.  I have ordered physical therapy and occupational therapy at home.  I am trying to get a home health aide for you.

## 2018-01-20 NOTE — ED Triage Notes (Signed)
Patient fell on the way back on the bathroom right next to bed. Patient fell on right side of arm and has large hematoma on right side of arm and small abrasion on right side of face.

## 2018-01-20 NOTE — ED Notes (Signed)
Pt and family verbalize d/c understanding and follow up. Pt in NAD, VSS, pt in wheelchair to lobby. Pt denies any further concerns regarding this visit

## 2018-01-20 NOTE — Clinical Social Work Note (Signed)
CSW received inappropriate consult for "Needs home health support." Please place a consult to [place] at [phone]. CSW signing off as no further Social Work intervention needed.   Oretha Ellis, Latanya Presser, [place] Social Worker-ED [phone]

## 2018-06-13 ENCOUNTER — Other Ambulatory Visit: Payer: Self-pay | Admitting: Specialist

## 2018-07-01 ENCOUNTER — Other Ambulatory Visit: Payer: Self-pay

## 2018-07-01 ENCOUNTER — Encounter
Admission: RE | Admit: 2018-07-01 | Discharge: 2018-07-01 | Disposition: A | Discharge status: Home / Self Care | Payer: Medicare Other | Source: Ambulatory Visit | Attending: Specialist | Admitting: Specialist

## 2018-07-01 DIAGNOSIS — Z01818 Encounter for other preprocedural examination: Secondary | ICD-10-CM | POA: Insufficient documentation

## 2018-07-01 HISTORY — DX: Personal history of other diseases of the digestive system: Z87.19

## 2018-07-01 HISTORY — DX: Sleep apnea, unspecified: G47.30

## 2018-07-01 NOTE — Patient Instructions (Signed)
  Your procedure is scheduled on: Monday July 07, 2018 Report to Same Day Surgery 2nd floor medical mall [facility] Entrance-take elevator on left to 2nd floor.  Check in with surgery information desk.) To find out your arrival time please call [phone] between 1PM - 3PM on Friday July 04, 2018  Remember: Instructions that are not followed completely may result in serious medical risk, up to and including death, or upon the discretion of your surgeon and anesthesiologist your surgery may need to be rescheduled.    _x___ 1. Do not eat food (including mints, candies, chewing gum) after midnight the night before your procedure. You may drink water up to 2 hours before you are scheduled to arrive at the hospital for your procedure.  Do not drink anything within 2 hours of your scheduled arrival to the hospital.   __x__ 2. No Alcohol for 24 hours before or after surgery.   __x__ 3. No Smoking or e-cigarettes for 24 prior to surgery.  Do not use any chewable tobacco products for at least 6 hour prior to surgery   __x__ 4. Notify your doctor if there is any change in your medical condition (cold, fever, infections).   __x__ 5. On the morning of surgery brush your teeth with toothpaste and water.  You may rinse your mouth with mouth wash if you wish.  Do not swallow any toothpaste or mouthwash.  Please read over the following fact sheets that you were given:   [facility] Preparing for Surgery and or MRSA Information    __x__ Use CHG Soap or sage wipes as directed on instruction sheet    Do not wear jewelry, make-up, hairpins, clips or nail polish.  Do not wear lotions, powders, deodorant, or perfumes.   Do not shave below the face/neck 48 hours prior to surgery.   Do not bring valuables to the hospital.    [facility] is not responsible for any belongings or valuables.               Contacts, dentures or bridgework may not be worn into surgery.  For patients discharged on the day  of surgery, you will NOT be permitted to drive yourself home.   _x___ Take anti-hypertensive listed below, cardiac, seizure, asthma, anti-reflux and psychiatric medicines. These include:  On the day of surgery take:  1. Omeprazole (Prilosec) 2. Oxybutynin (Ditropan)   Do NOT take your Furosemide (Lasix) on the day of surgery.  _x___ Follow recommendations from Cardiologist, Pulmonologist or PCP regarding stopping Aspirin, Coumadin, Plavix ,Eliquis, Effient, or Pradaxa, and Pletal.  _x___ Stop Anti-inflammatories such as Advil, Aleve, Ibuprofen, Motrin, Naproxen, Naprosyn, Goodies powders or aspirin products. OK to take Tylenol and Celebrex.   _x___ NOW: Stop Iron supplements until after surgery.  But may continue Vitamin D, Vitamin B, and multivitamin.

## 2018-07-01 NOTE — Pre-Procedure Instructions (Signed)
Clearance form faxed to Dr. Linton Ham office requesting EKG done in office 9/20 to be faxed to PAT and inform pt if/when she needs to stop daily aspirin.

## 2018-07-01 NOTE — Pre-Procedure Instructions (Signed)
Pt refused to sign consent form for R carpal tunnel release.  Pt stated that she is having surgery on L wrist.  Pt's daughter also stated upcoming surgery is for the L wrist.  LM for Judeen Hammans at [facility].  F/u call to [facility], this RN discussed situation w/ Megan.  Megan stated Dr. Ammie Ferrier notes confirm R carpal tunnel release.  Jinny Blossom will f/u w/ Dr. Sabra Heck on 10/2 and call PAT.

## 2018-07-06 MED ORDER — CLINDAMYCIN PHOSPHATE 600 MG/50ML IV SOLN
600.0000 mg | INTRAVENOUS | Status: AC
  Administered 2018-07-07: 600 mg via INTRAVENOUS

## 2018-07-06 MED ORDER — CEFAZOLIN SODIUM-DEXTROSE 2-4 GM/100ML-% IV SOLN
2.0000 g | INTRAVENOUS | Status: AC
  Administered 2018-07-07: 2 g via INTRAVENOUS

## 2018-07-07 ENCOUNTER — Ambulatory Visit
Admission: RE | Admit: 2018-07-07 | Discharge: 2018-07-07 | Disposition: A | Discharge status: Home / Self Care | Payer: Medicare Other | Source: Ambulatory Visit | Attending: Specialist | Admitting: Specialist

## 2018-07-07 ENCOUNTER — Encounter: Payer: Self-pay | Admitting: Certified Registered Nurse Anesthetist

## 2018-07-07 ENCOUNTER — Other Ambulatory Visit: Payer: Self-pay

## 2018-07-07 ENCOUNTER — Ambulatory Visit: Payer: Medicare Other | Admitting: Certified Registered Nurse Anesthetist

## 2018-07-07 ENCOUNTER — Encounter
Admission: RE | Disposition: A | Discharge status: Home / Self Care | Payer: Self-pay | Source: Ambulatory Visit | Attending: Specialist

## 2018-07-07 DIAGNOSIS — K219 Gastro-esophageal reflux disease without esophagitis: Secondary | ICD-10-CM | POA: Diagnosis not present

## 2018-07-07 DIAGNOSIS — I509 Heart failure, unspecified: Secondary | ICD-10-CM | POA: Diagnosis not present

## 2018-07-07 DIAGNOSIS — Z79899 Other long term (current) drug therapy: Secondary | ICD-10-CM | POA: Diagnosis not present

## 2018-07-07 DIAGNOSIS — E119 Type 2 diabetes mellitus without complications: Secondary | ICD-10-CM | POA: Diagnosis not present

## 2018-07-07 DIAGNOSIS — G5602 Carpal tunnel syndrome, left upper limb: Secondary | ICD-10-CM | POA: Diagnosis present

## 2018-07-07 DIAGNOSIS — Z7984 Long term (current) use of oral hypoglycemic drugs: Secondary | ICD-10-CM | POA: Diagnosis not present

## 2018-07-07 DIAGNOSIS — I110 Hypertensive heart disease with heart failure: Secondary | ICD-10-CM | POA: Diagnosis not present

## 2018-07-07 DIAGNOSIS — G4733 Obstructive sleep apnea (adult) (pediatric): Secondary | ICD-10-CM | POA: Diagnosis not present

## 2018-07-07 HISTORY — PX: CARPAL TUNNEL RELEASE: SHX101

## 2018-07-07 LAB — GLUCOSE, CAPILLARY
GLUCOSE-CAPILLARY: 134 mg/dL — AB (ref 70–99)
Glucose-Capillary: 143 mg/dL — ABNORMAL HIGH (ref 70–99)

## 2018-07-07 SURGERY — CARPAL TUNNEL RELEASE
Anesthesia: General | Site: Hand | Laterality: Left

## 2018-07-07 MED ORDER — FENTANYL CITRATE (PF) 100 MCG/2ML IJ SOLN: 25.0000 ug | INTRAMUSCULAR | Status: DC | PRN

## 2018-07-07 MED ORDER — SODIUM CHLORIDE 0.9 % IV SOLN
INTRAVENOUS | Status: DC
  Administered 2018-07-07: 10:00:00 via INTRAVENOUS

## 2018-07-07 MED ORDER — FENTANYL CITRATE (PF) 100 MCG/2ML IJ SOLN
INTRAMUSCULAR | Status: DC | PRN
  Administered 2018-07-07 (×4): 25 ug via INTRAVENOUS

## 2018-07-07 MED ORDER — BUPIVACAINE HCL (PF) 0.5 % IJ SOLN
INTRAMUSCULAR | Status: DC | PRN
  Administered 2018-07-07: 15 mL

## 2018-07-07 MED ORDER — CHLORHEXIDINE GLUCONATE CLOTH 2 % EX PADS: 6.0000 | MEDICATED_PAD | Freq: Once | CUTANEOUS | Status: DC

## 2018-07-07 MED ORDER — ONDANSETRON HCL 4 MG/2ML IJ SOLN
INTRAMUSCULAR | Status: DC | PRN
  Administered 2018-07-07: 4 mg via INTRAVENOUS

## 2018-07-07 MED ORDER — LIDOCAINE HCL (CARDIAC) PF 100 MG/5ML IV SOSY
PREFILLED_SYRINGE | INTRAVENOUS | Status: DC | PRN
  Administered 2018-07-07: 60 mg via INTRAVENOUS

## 2018-07-07 MED ORDER — PROPOFOL 10 MG/ML IV BOLUS
INTRAVENOUS | Status: AC
  Filled 2018-07-07: qty 20

## 2018-07-07 MED ORDER — DEXAMETHASONE SODIUM PHOSPHATE 10 MG/ML IJ SOLN
INTRAMUSCULAR | Status: DC | PRN
  Administered 2018-07-07: 5 mg via INTRAVENOUS

## 2018-07-07 MED ORDER — GABAPENTIN 300 MG PO CAPS
300.0000 mg | ORAL_CAPSULE | ORAL | Status: AC
  Administered 2018-07-07: 300 mg via ORAL

## 2018-07-07 MED ORDER — CLINDAMYCIN PHOSPHATE 600 MG/50ML IV SOLN
INTRAVENOUS | Status: AC
  Filled 2018-07-07: qty 50

## 2018-07-07 MED ORDER — GABAPENTIN 400 MG PO CAPS: 400.0000 mg | ORAL_CAPSULE | Freq: Three times a day (TID) | ORAL | 3 refills | Status: AC

## 2018-07-07 MED ORDER — GABAPENTIN 300 MG PO CAPS
ORAL_CAPSULE | ORAL | Status: AC
  Administered 2018-07-07: 300 mg via ORAL
  Filled 2018-07-07: qty 1

## 2018-07-07 MED ORDER — FENTANYL CITRATE (PF) 100 MCG/2ML IJ SOLN
INTRAMUSCULAR | Status: AC
  Filled 2018-07-07: qty 2

## 2018-07-07 MED ORDER — PROPOFOL 10 MG/ML IV BOLUS
INTRAVENOUS | Status: DC | PRN
  Administered 2018-07-07: 20 mg via INTRAVENOUS
  Administered 2018-07-07: 30 mg via INTRAVENOUS
  Administered 2018-07-07: 100 mg via INTRAVENOUS

## 2018-07-07 MED ORDER — PHENYLEPHRINE HCL 10 MG/ML IJ SOLN
INTRAMUSCULAR | Status: DC | PRN
  Administered 2018-07-07: 100 ug via INTRAVENOUS

## 2018-07-07 MED ORDER — CEFAZOLIN SODIUM-DEXTROSE 2-4 GM/100ML-% IV SOLN
INTRAVENOUS | Status: AC
  Filled 2018-07-07: qty 100

## 2018-07-07 MED ORDER — DEXAMETHASONE SODIUM PHOSPHATE 10 MG/ML IJ SOLN
INTRAMUSCULAR | Status: AC
  Filled 2018-07-07: qty 1

## 2018-07-07 MED ORDER — ONDANSETRON HCL 4 MG/2ML IJ SOLN
INTRAMUSCULAR | Status: AC
  Filled 2018-07-07: qty 2

## 2018-07-07 MED ORDER — MELOXICAM 15 MG PO TABS: 15.0000 mg | ORAL_TABLET | Freq: Every day | ORAL | 3 refills | Status: AC

## 2018-07-07 MED ORDER — HYDROCODONE-ACETAMINOPHEN 5-325 MG PO TABS: 1.0000 | ORAL_TABLET | Freq: Four times a day (QID) | ORAL | 0 refills | Status: DC | PRN

## 2018-07-07 MED ORDER — LIDOCAINE HCL (PF) 2 % IJ SOLN
INTRAMUSCULAR | Status: AC
  Filled 2018-07-07: qty 10

## 2018-07-07 MED ORDER — ONDANSETRON HCL 4 MG/2ML IJ SOLN: 4.0000 mg | Freq: Once | INTRAMUSCULAR | Status: DC | PRN

## 2018-07-07 SURGICAL SUPPLY — 28 items
BLADE SURG MINI STRL (BLADE) ×3 IMPLANT
BNDG ESMARK 4X12 TAN STRL LF (GAUZE/BANDAGES/DRESSINGS) ×3 IMPLANT
CANISTER SUCT 1200ML W/VALVE (MISCELLANEOUS) ×3 IMPLANT
CHLORAPREP W/TINT 26ML (MISCELLANEOUS) ×3 IMPLANT
CUFF TOURN 18 STER (MISCELLANEOUS) ×3 IMPLANT
DRSG GAUZE FLUFF 36X18 (GAUZE/BANDAGES/DRESSINGS) ×3 IMPLANT
ELECT REM PT RETURN 9FT ADLT (ELECTROSURGICAL) ×3
ELECTRODE REM PT RTRN 9FT ADLT (ELECTROSURGICAL) ×1 IMPLANT
GAUZE PETRO XEROFOAM 1X8 (MISCELLANEOUS) ×3 IMPLANT
GLOVE BIO SURGEON STRL SZ8 (GLOVE) ×3 IMPLANT
GLOVE BIOGEL PI IND STRL 7.5 (GLOVE) ×5 IMPLANT
GLOVE BIOGEL PI INDICATOR 7.5 (GLOVE) ×10
GOWN STRL REUS W/ TWL LRG LVL3 (GOWN DISPOSABLE) ×1 IMPLANT
GOWN STRL REUS W/TWL LRG LVL3 (GOWN DISPOSABLE) ×2
GOWN STRL REUS W/TWL LRG LVL4 (GOWN DISPOSABLE) ×3 IMPLANT
KIT TURNOVER KIT A (KITS) ×3 IMPLANT
NS IRRIG 500ML POUR BTL (IV SOLUTION) ×3 IMPLANT
PACK EXTREMITY ARMC (MISCELLANEOUS) ×3 IMPLANT
PAD PREP 24X41 OB/GYN DISP (PERSONAL CARE ITEMS) ×3 IMPLANT
PADDING CAST 4IN STRL (MISCELLANEOUS) ×2
PADDING CAST BLEND 4X4 STRL (MISCELLANEOUS) ×1 IMPLANT
SPLINT CAST 1 STEP 3X12 (MISCELLANEOUS) ×3 IMPLANT
STOCKINETTE BIAS CUT 4 980044 (GAUZE/BANDAGES/DRESSINGS) ×3 IMPLANT
STOCKINETTE STRL 4IN 9604848 (GAUZE/BANDAGES/DRESSINGS) ×3 IMPLANT
SUT ETHILON 4-0 (SUTURE) ×2
SUT ETHILON 4-0 FS2 18XMFL BLK (SUTURE) ×1
SUT ETHILON 5-0 FS-2 18 BLK (SUTURE) ×3 IMPLANT
SUTURE ETHLN 4-0 FS2 18XMF BLK (SUTURE) ×1 IMPLANT

## 2018-07-07 NOTE — Progress Notes (Signed)
Left arm elevated on pillows  Can wiggle fingers on left capillary refill positive to left

## 2018-07-07 NOTE — Anesthesia Post-op Follow-up Note (Signed)
Anesthesia QCDR form completed.        

## 2018-07-07 NOTE — Anesthesia Preprocedure Evaluation (Signed)
Anesthesia Evaluation  Patient identified by MRN, date of birth, ID band Patient awake    Reviewed: Allergy & Precautions, H&P , NPO status , Patient's Chart, lab work & pertinent test results, reviewed documented beta blocker date and time   Airway Mallampati: II   Neck ROM: full    Dental  (+) Poor Dentition, Teeth Intact, Upper Dentures   Pulmonary neg pulmonary ROS, sleep apnea and Continuous Positive Airway Pressure Ventilation ,    Pulmonary exam normal        Cardiovascular Exercise Tolerance: Poor hypertension, On Medications +CHF  negative cardio ROS Normal cardiovascular exam+ Valvular Problems/Murmurs AS  Rhythm:regular Rate:Normal     Neuro/Psych negative neurological ROS  negative psych ROS   GI/Hepatic negative GI ROS, Neg liver ROS, hiatal hernia, GERD  Medicated,  Endo/Other  negative endocrine ROSdiabetes, Well Controlled, Type 2, Oral Hypoglycemic Agents  Renal/GU negative Renal ROS  negative genitourinary   Musculoskeletal   Abdominal   Peds  Hematology negative hematology ROS (+)   Anesthesia Other Findings Past Medical History: No date: Acid reflux No date: CHF (congestive heart failure) ([facility]) No date: Diabetes mellitus without complication ([facility]) No date: Dysphagia No date: History of hiatal hernia No date: Hypertension No date: Sleep apnea Past Surgical History: No date: APPENDECTOMY 2018: CARDIAC VALVE REPLACEMENT No date: CHOLECYSTECTOMY BMI    Body Mass Index:  31.42 kg/m     Reproductive/Obstetrics negative OB ROS                             Anesthesia Physical Anesthesia Plan  ASA: III  Anesthesia Plan: General   Post-op Pain Management:    Induction:   PONV Risk Score and Plan:   Airway Management Planned:   Additional Equipment:   Intra-op Plan:   Post-operative Plan:   Informed Consent: I have reviewed the patients History and  Physical, chart, labs and discussed the procedure including the risks, benefits and alternatives for the proposed anesthesia with the patient or authorized representative who has indicated his/her understanding and acceptance.   Dental Advisory Given  Plan Discussed with: CRNA  Anesthesia Plan Comments:         Anesthesia Quick Evaluation

## 2018-07-07 NOTE — Transfer of Care (Signed)
Immediate Anesthesia Transfer of Care Note  Patient: Kayla Gomez  Procedure(s) Performed: CARPAL TUNNEL RELEASE (Left Hand)  Patient Location: PACU  Anesthesia Type:General  Level of Consciousness: sedated  Airway & Oxygen Therapy: Patient connected to face mask oxygen  Post-op Assessment: Post -op Vital signs reviewed and stable  Post vital signs: stable  Last Vitals:  Vitals Value Taken Time  BP 130/52 07/07/2018 11:40 AM  Temp    Pulse 59 07/07/2018 11:42 AM  Resp 10 07/07/2018 11:42 AM  SpO2 100 % 07/07/2018 11:42 AM  Vitals shown include unvalidated device data.  Last Pain:  Vitals:   07/07/18 0857  TempSrc: Temporal  PainSc: 0-No pain         Complications: No apparent anesthesia complications

## 2018-07-07 NOTE — Progress Notes (Signed)
Patient brought back and went over consent. There was some confusion with which side surgery was going to be performed on. Consent stated "right" patient and family stated "left". Dr Sabra Heck came in and spoke with patient and they are in agreement that the left carpal tunnel is the surgery and side we are doing. Dr Sabra Heck signed the left arm. Patient signed consent that states left carpal tunnel release.

## 2018-07-07 NOTE — H&P (Signed)
THE PATIENT WAS SEEN PRIOR TO SURGERY TODAY.  HISTORY, ALLERGIES, HOME MEDICATIONS AND OPERATIVE PROCEDURE WERE REVIEWED. RISKS AND BENEFITS OF SURGERY DISCUSSED WITH PATIENT AGAIN.  NO CHANGES FROM INITIAL HISTORY AND PHYSICAL NOTED.   AFTER A LONG DISCUSSION, SHE INSISTED THAT SHE WANTED THE LEFT HAND RELEASED AS IT WAS THE MOST PAINFUL.  SHE HAS HAD PRIOR SURGERY ON THIS AND I EXPLAINED THAT THE RESULTS WERE NOT AS GOOD GENERALLY THE SECOND TIME THE LIGAMENT WAS RELEASED. SHE AND HER FAMILY WERE COMFORTABLE WITH THIS.

## 2018-07-07 NOTE — Anesthesia Procedure Notes (Signed)
Procedure Name: LMA Insertion Date/Time: 07/07/2018 10:40 AM Performed by: Johnna Acosta, CRNA Pre-anesthesia Checklist: Patient identified Patient Re-evaluated:Patient Re-evaluated prior to induction Oxygen Delivery Method: Circle system utilized Preoxygenation: Pre-oxygenation with 100% oxygen Induction Type: IV induction Ventilation: Mask ventilation without difficulty and Oral airway inserted - appropriate to patient size LMA: LMA inserted LMA Size: 3.5 Tube type: Oral Number of attempts: 1 Placement Confirmation: positive ETCO2 and breath sounds checked- equal and bilateral Tube secured with: Tape Dental Injury: Teeth and Oropharynx as per pre-operative assessment

## 2018-07-07 NOTE — Op Note (Signed)
07/07/2018  11:35 AM  PATIENT:  Kayla Gomez    PRE-OPERATIVE DIAGNOSIS: RECURRENT LEFT CARPAL TUNNEL SYNDROME  POST-OPERATIVE DIAGNOSIS: RECURENTLEFT CARPAL TUNNEL SYNDROME  PROCEDURE:  REPEAT LEFT CARPAL TUNNEL RELEASE  SURGEON: Park Breed, MD  TOURNIQUET TIME: 22  MIN   ANESTHESIA:   General  PREOPERATIVE INDICATIONS:  Kayla Gomez is a  84 y.o. female with a diagnosis of left carpal tunnel syndrome who failed conservative measures and elected for surgical management.  She had a prior left carpal tunnel syndrome but was having significant recurrent symptoms and had markedly positive nerve conduction studies which were positive.  She and her family were made aware of the fact that the results of repeat surgery was not generally as good as a the original procedure and they still wish to proceed.  The risks benefits and alternatives were discussed with the patient preoperatively including but not limited to the risks of infection, bleeding, nerve injury, incomplete relief of symptoms, pillar pain, cardiopulmonary complications, the need for revision surgery, among others, and the patient was willing to proceed.  OPERATIVE FINDINGS: Thickened volar ligament and severe nerve compression.  OPERATIVE PROCEDURE: The patient is brought to the operating room placed in the supine position. General anesthesia was administered. The left upper extremity was prepped and draped in usual sterile fashion. Time out was performed. The arm was elevated and exsanguinated and the tourniquet was inflated. Incision was made in line with the radial border of the ring finger and extended proximally across the wrist.  The carpal tunnel transverse fascia was identified, cleaned, and incised sharply. The common sensory branches were visualized along with the superficial palmar arch and protected.  The median nerve was protected below. A Kelly clamp was  placed underneath the transverse carpal ligament,  protecting the nerve. I released the ligament completely, and then released the proximal distal volar forearm fascia. The nerve was identified, and visualized, and protected throughout the case.  The area beneath the ligament itself was severely constricted.  The motor branch was intact upon inspection. No masses or abnormalities were identified in the ulnar bursa.  The wounds were irrigated copiously and the skin closed with nylon. The wound was injected with 1/2 % marcaine followed by a sterile dressing and volar splint. Tourniquet was deflated with good return of blood flow to all fingers. Sponge and needle counts were correct.  The patient tolerated this well, with no complications. The patient was awakened and taken to recovery in good condition.

## 2018-07-09 NOTE — Anesthesia Postprocedure Evaluation (Signed)
Anesthesia Post Note  Patient: Kayla Gomez  Procedure(s) Performed: CARPAL TUNNEL RELEASE (Left Hand)  Patient location during evaluation: PACU Anesthesia Type: General Level of consciousness: awake and alert Pain management: pain level controlled Vital Signs Assessment: post-procedure vital signs reviewed and stable Respiratory status: spontaneous breathing, nonlabored ventilation, respiratory function stable and patient connected to nasal cannula oxygen Cardiovascular status: blood pressure returned to baseline and stable Postop Assessment: no apparent nausea or vomiting Anesthetic complications: no     Last Vitals:  Vitals:   07/07/18 1243 07/07/18 1307  BP: (!) 158/63 (!) 146/59  Pulse: 64 64  Resp: 16   Temp: 36.4 C   SpO2: 97% 97%    Last Pain:  Vitals:   07/07/18 1307  TempSrc:   PainSc: 0-No pain                 Molli Barrows

## 2018-08-27 IMAGING — CR DG CHEST 2V
1 series · 2 of 2 positions shown · non-contrast
Comparison: 12/26/2012 chest radiograph

CLINICAL DATA: 89 y/o  F; chest pain and abdominal pain.

EXAM:
CHEST  2 VIEW

[Series 1: dg chest 2 view · 0.14mm/px · 2 of 2 slices shown]
[im 1/2]
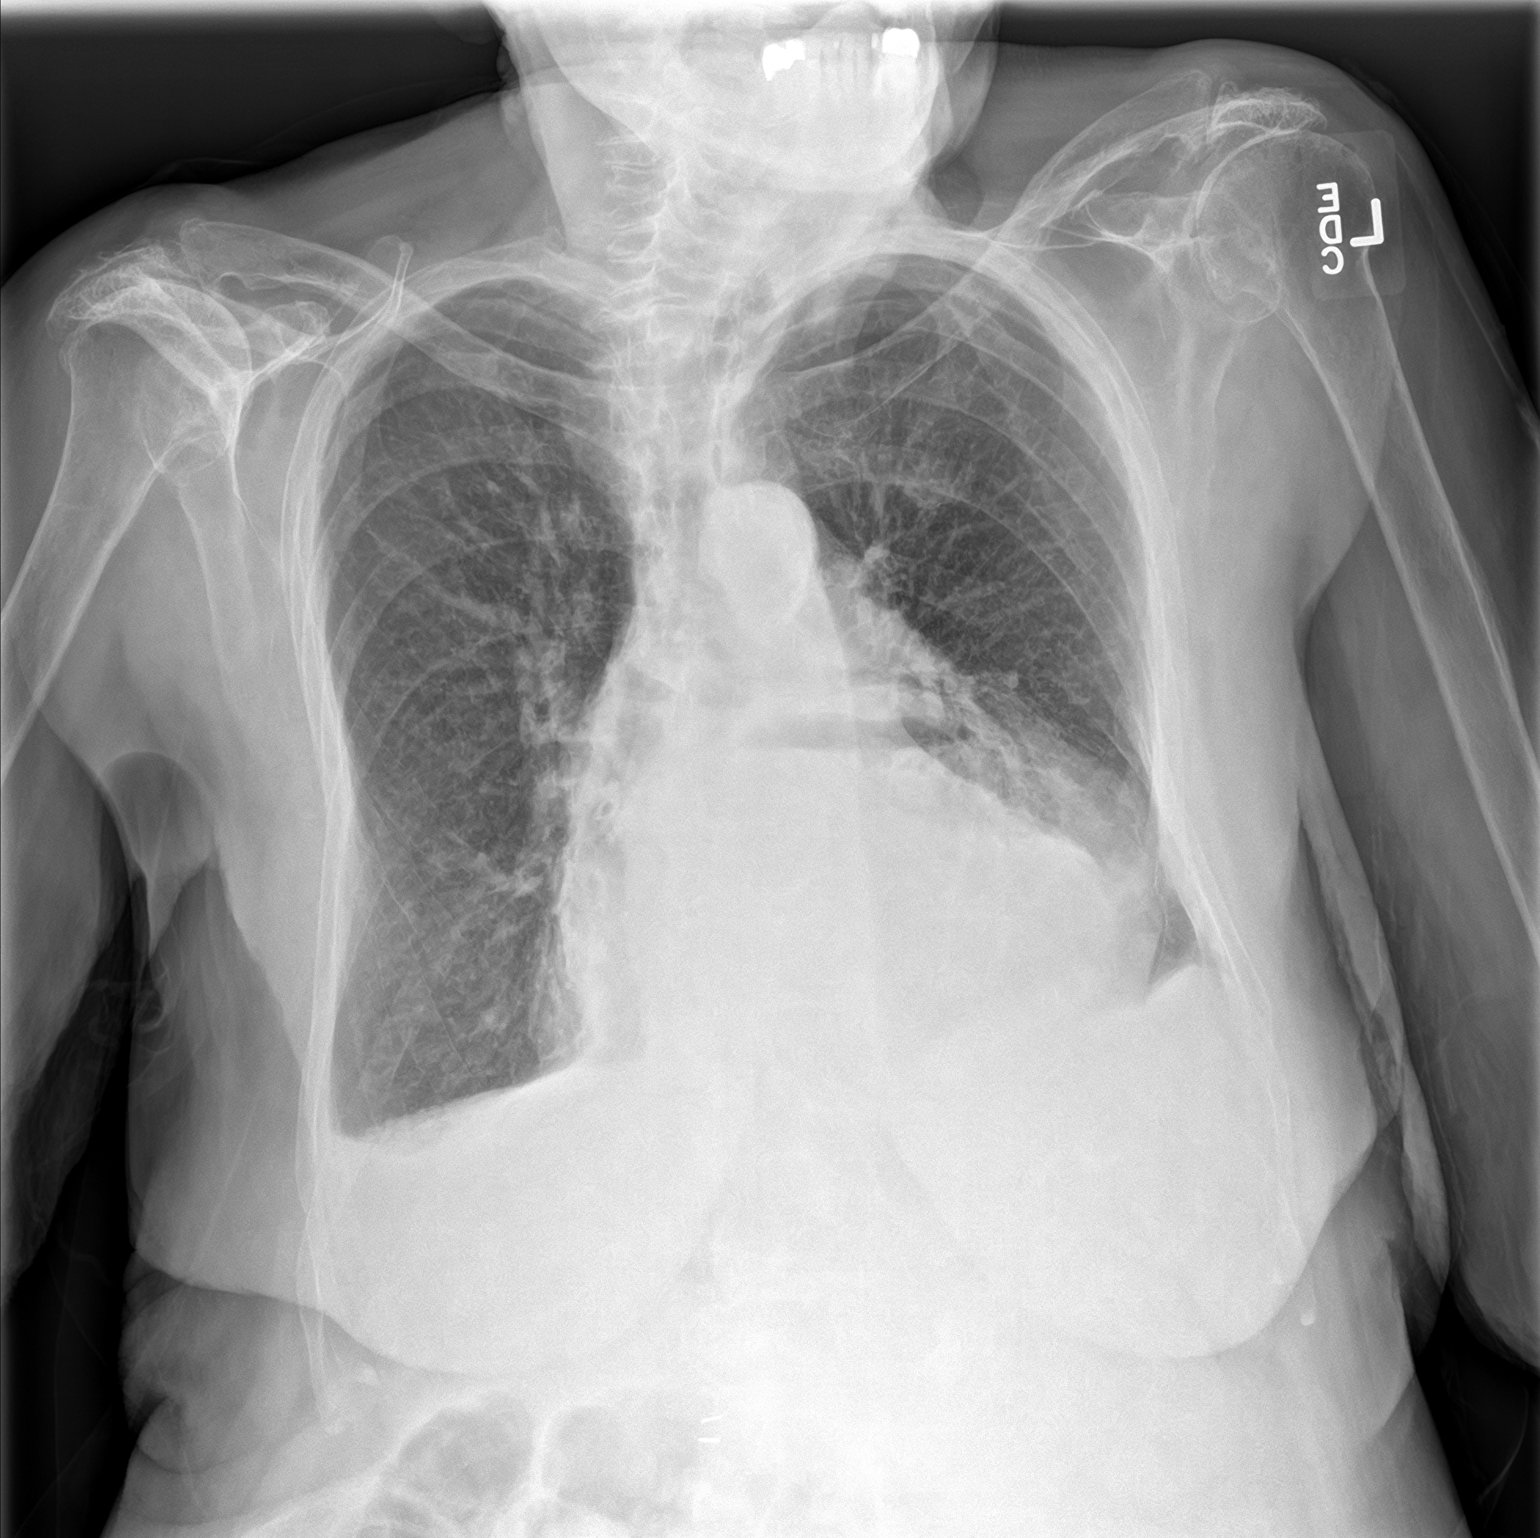
[im 2/2]
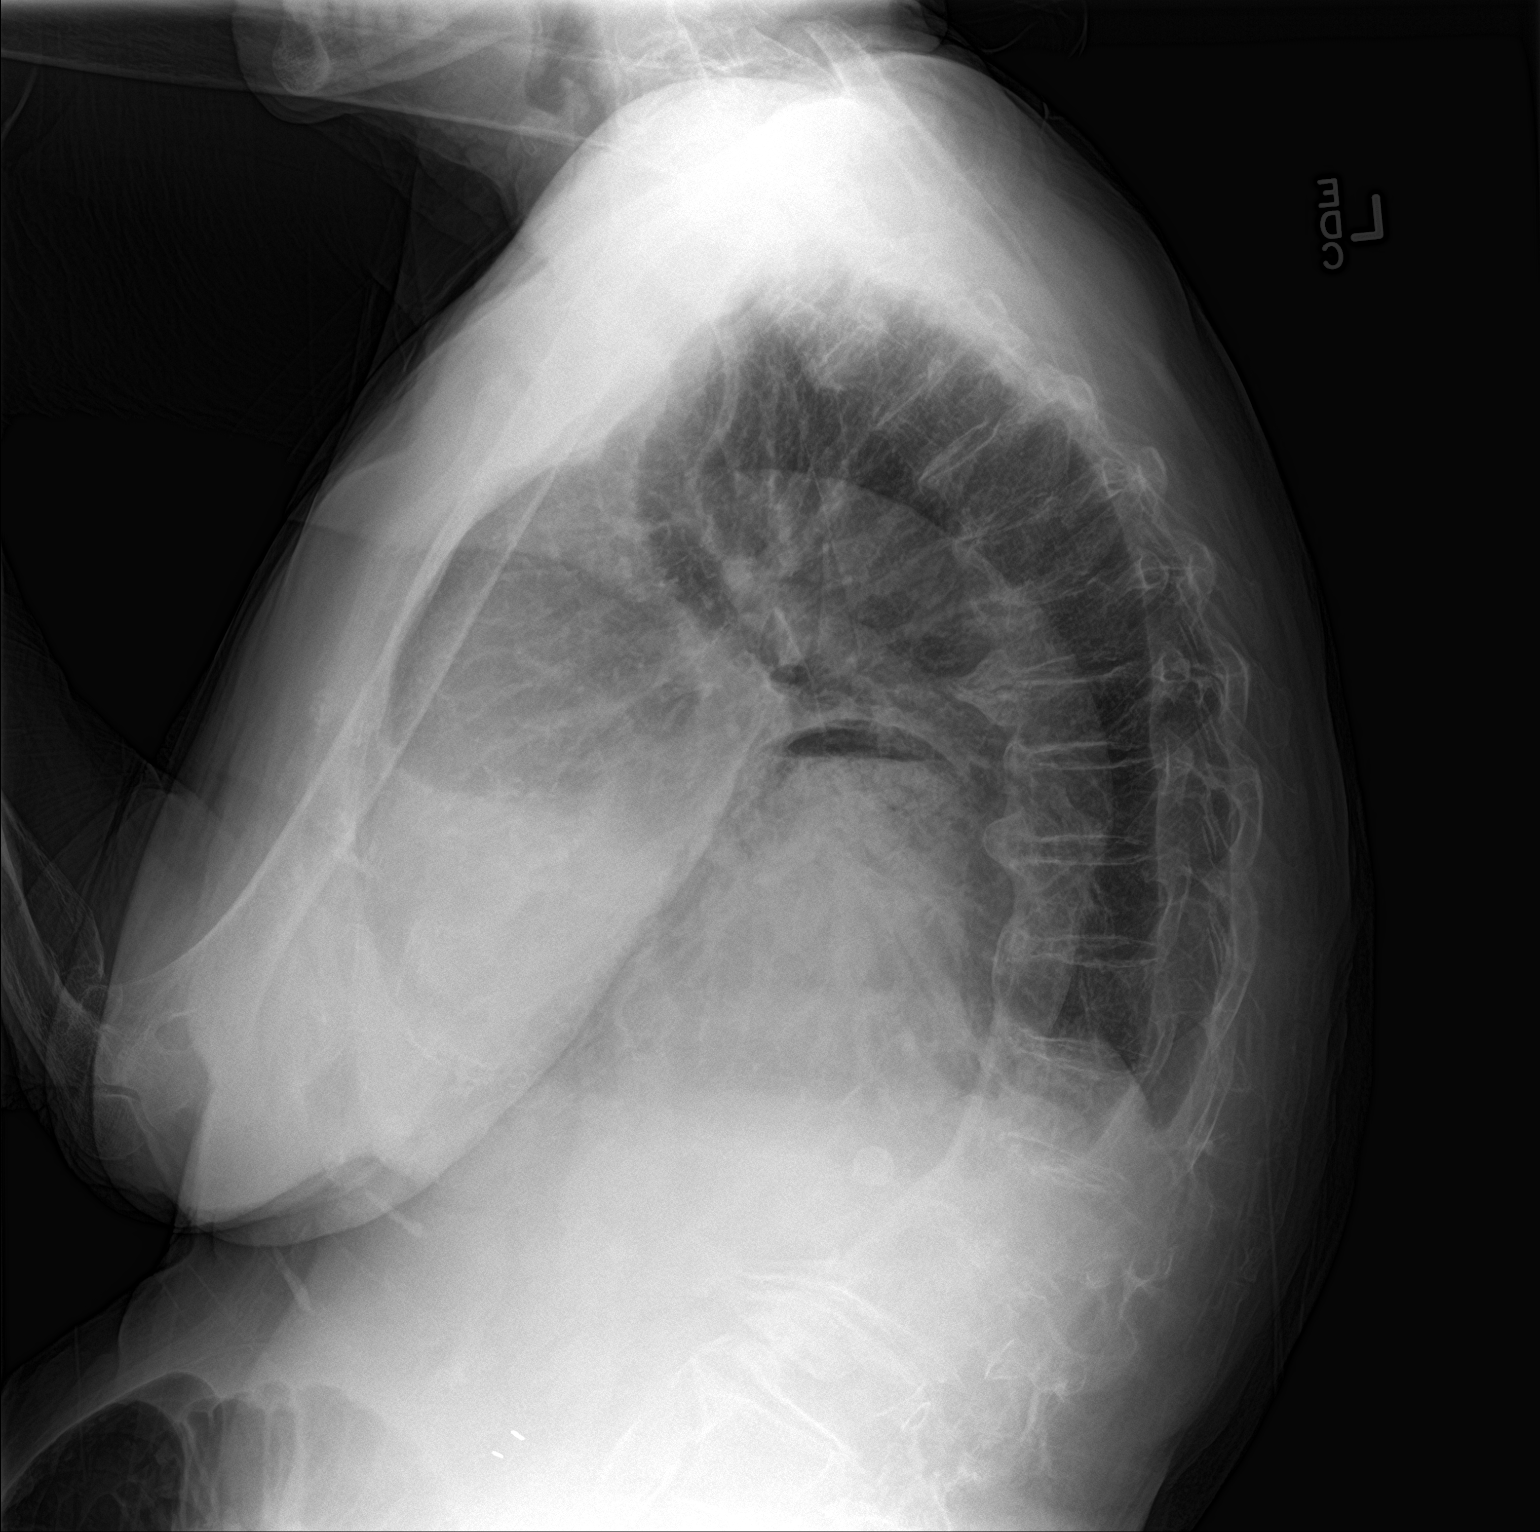

[2 of 2 positions shown; findings below may reference images not displayed]

FINDINGS: Stable cardiomegaly. Aortic atherosclerosis with calcification.
Pulmonary venous hypertension. Large hiatal hernia. Ill-defined left
basilar opacity is probably atelectasis. Small bilateral pleural
effusions. No pneumothorax. No acute osseous abnormality is evident.
IMPRESSION: 1. Stable cardiomegaly.  Pulmonary venous hypertension.
2. Large hiatal hernia.
3. Aortic atherosclerosis.
4. Left basilar opacity is probably atelectasis.
5. Small bilateral pleural effusions.

By: Teruyasu Cueto Kohatsu M.D.

## 2018-09-29 ENCOUNTER — Other Ambulatory Visit (HOSPITAL_COMMUNITY): Payer: Self-pay | Admitting: Family Medicine

## 2018-09-29 ENCOUNTER — Ambulatory Visit
Admission: RE | Admit: 2018-09-29 | Discharge: 2018-09-29 | Disposition: A | Discharge status: Home / Self Care | Payer: Medicare Other | Source: Ambulatory Visit | Attending: Family Medicine | Admitting: Family Medicine

## 2018-09-29 ENCOUNTER — Other Ambulatory Visit: Payer: Self-pay | Admitting: Family Medicine

## 2018-09-29 DIAGNOSIS — M79604 Pain in right leg: Secondary | ICD-10-CM | POA: Diagnosis not present

## 2018-09-29 DIAGNOSIS — L539 Erythematous condition, unspecified: Secondary | ICD-10-CM | POA: Insufficient documentation

## 2018-09-29 DIAGNOSIS — M7989 Other specified soft tissue disorders: Secondary | ICD-10-CM | POA: Diagnosis present

## 2018-12-04 ENCOUNTER — Other Ambulatory Visit: Payer: Self-pay | Admitting: Nurse Practitioner

## 2018-12-04 DIAGNOSIS — R1310 Dysphagia, unspecified: Secondary | ICD-10-CM

## 2018-12-04 DIAGNOSIS — Z9889 Other specified postprocedural states: Secondary | ICD-10-CM

## 2018-12-04 DIAGNOSIS — Z8719 Personal history of other diseases of the digestive system: Secondary | ICD-10-CM

## 2018-12-11 ENCOUNTER — Ambulatory Visit
Admission: RE | Admit: 2018-12-11 | Discharge: 2018-12-11 | Disposition: A | Discharge status: Home / Self Care | Payer: Medicare Other | Source: Ambulatory Visit | Attending: Nurse Practitioner | Admitting: Nurse Practitioner

## 2018-12-11 ENCOUNTER — Ambulatory Visit: Payer: Medicare Other

## 2018-12-11 DIAGNOSIS — R1310 Dysphagia, unspecified: Secondary | ICD-10-CM | POA: Diagnosis not present

## 2018-12-11 DIAGNOSIS — Z9889 Other specified postprocedural states: Secondary | ICD-10-CM | POA: Diagnosis present

## 2018-12-11 DIAGNOSIS — Z8719 Personal history of other diseases of the digestive system: Secondary | ICD-10-CM

## 2019-03-14 IMAGING — CR DG HUMERUS 2V *R*
1 series · 3 of 3 positions shown · non-contrast
Comparison: Chest radiograph performed 07/05/2017

CLINICAL DATA: Status post fall, with right arm pain and hematoma.

EXAM:
RIGHT HUMERUS - 2+ VIEW

[Series 1: dg humerus right · 0.14mm/px · 3 of 3 slices shown]
[im 1/3]
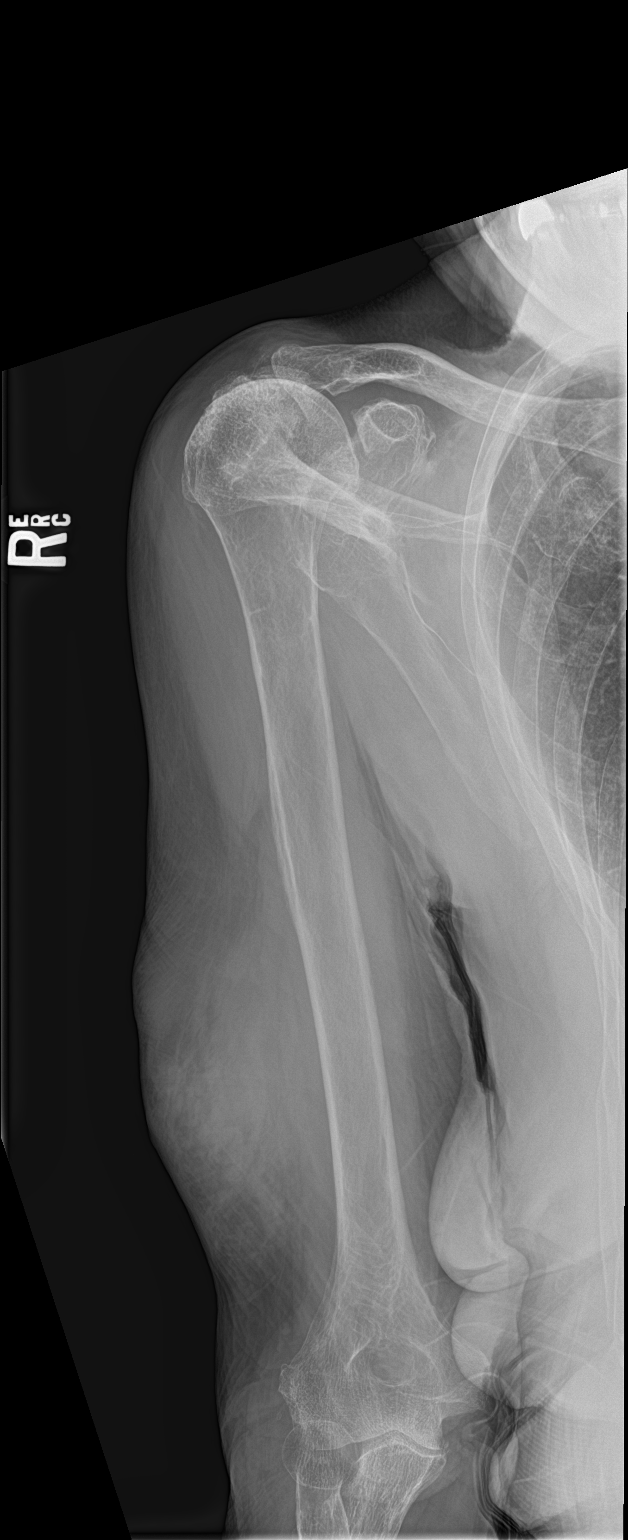
[im 2/3]
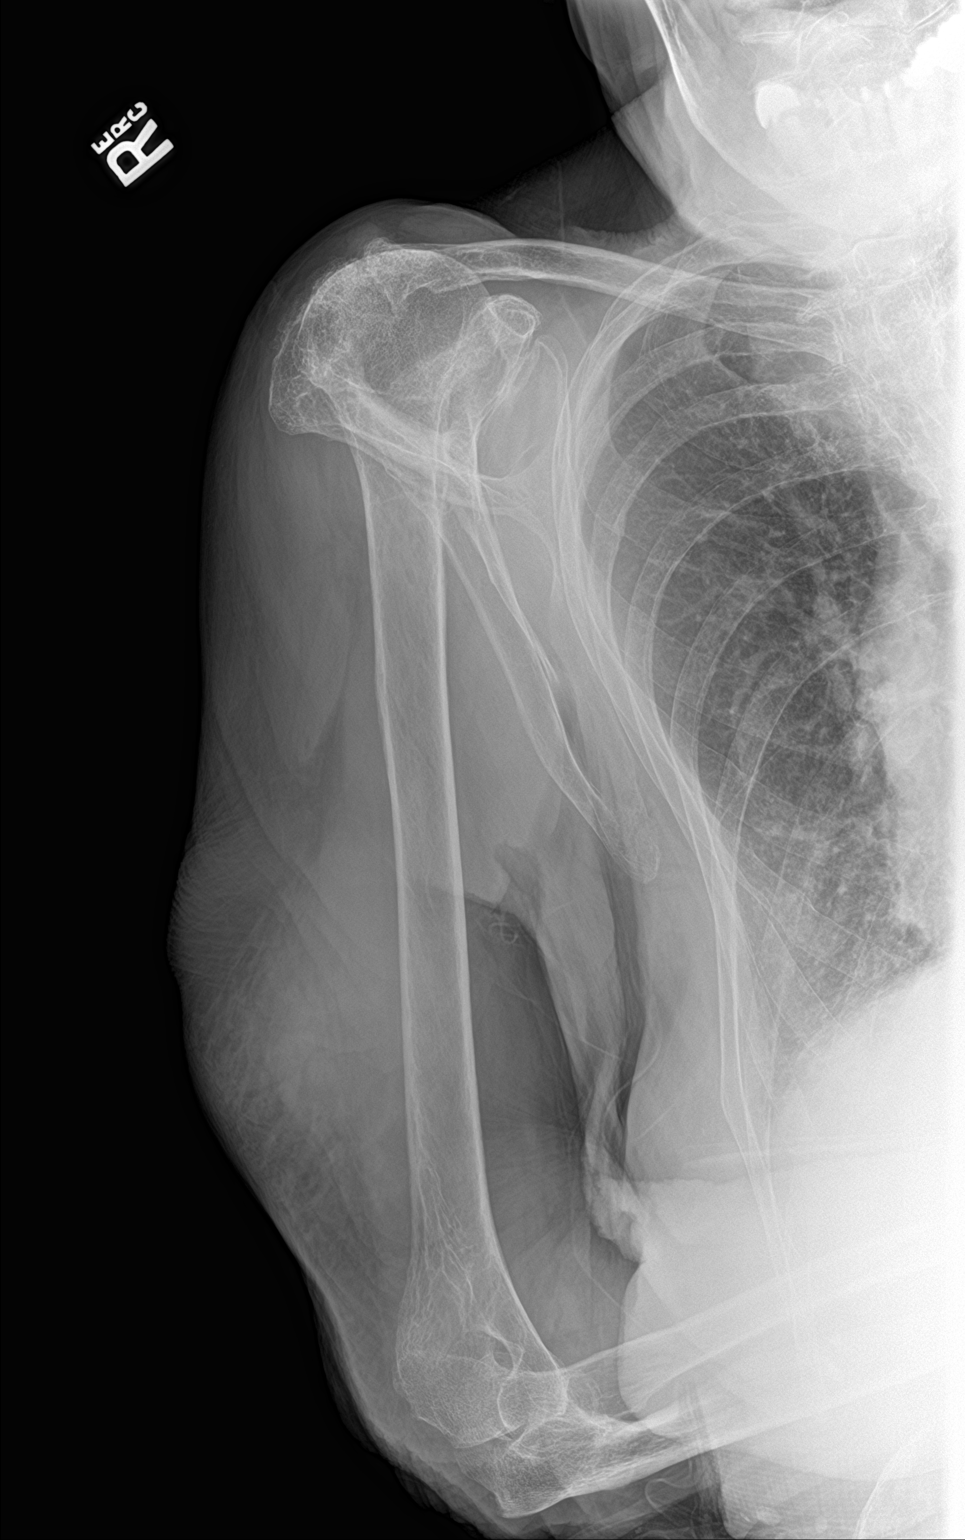
[im 3/3]
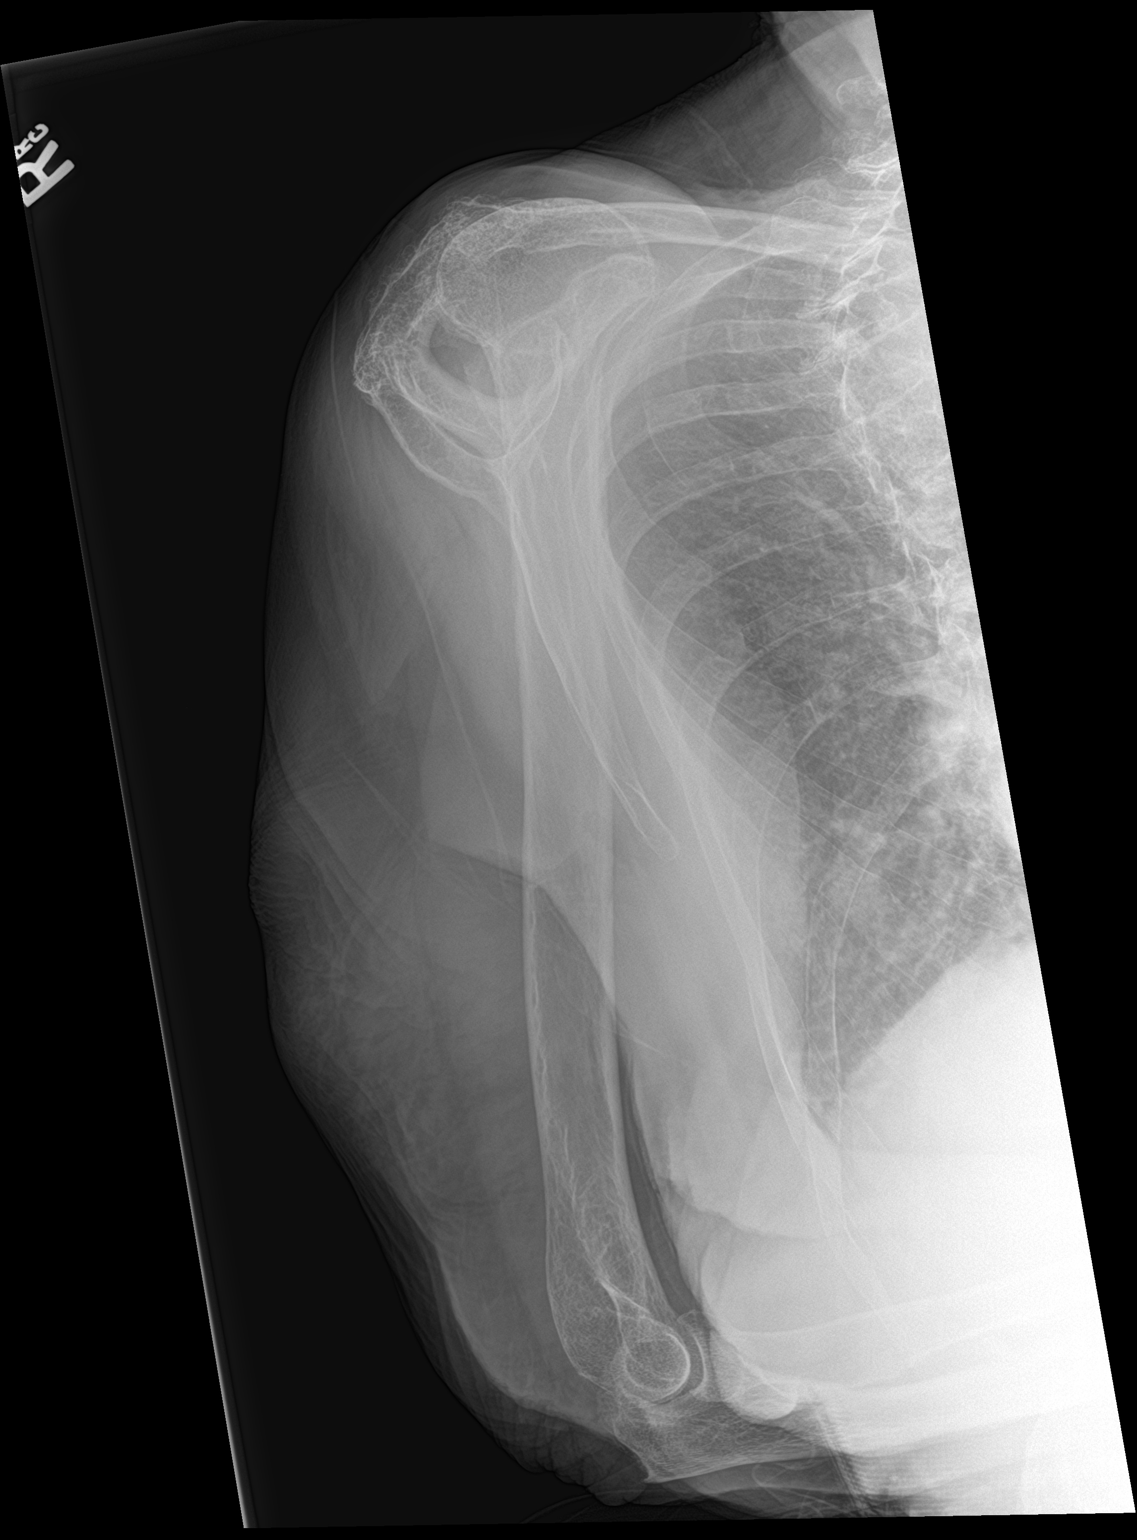

[3 of 3 positions shown; findings below may reference images not displayed]

FINDINGS: There appears to be increased superior subluxation of the humeral
head from 9647. Would correlate with the patient's symptoms. There
is no evidence of fracture.

Prominent soft tissue swelling is noted about the right arm. The
visualized portions of the right lung appear clear.
IMPRESSION: 1. Apparent increased superior subluxation of the humeral head from
9647. Would correlate with the patient's symptoms as to whether this
is chronic.
2. No evidence of fracture.

## 2019-03-14 IMAGING — CR DG WRIST COMPLETE 3+V*R*
1 series · 4 of 4 positions shown · non-contrast
Comparison: None.

CLINICAL DATA: Status post fall, with right wrist pain. Initial
encounter.

EXAM:
RIGHT WRIST - COMPLETE 3+ VIEW

[Series 1: dg wrist complete right · 0.14mm/px · 4 of 4 slices shown]
[im 1/4]
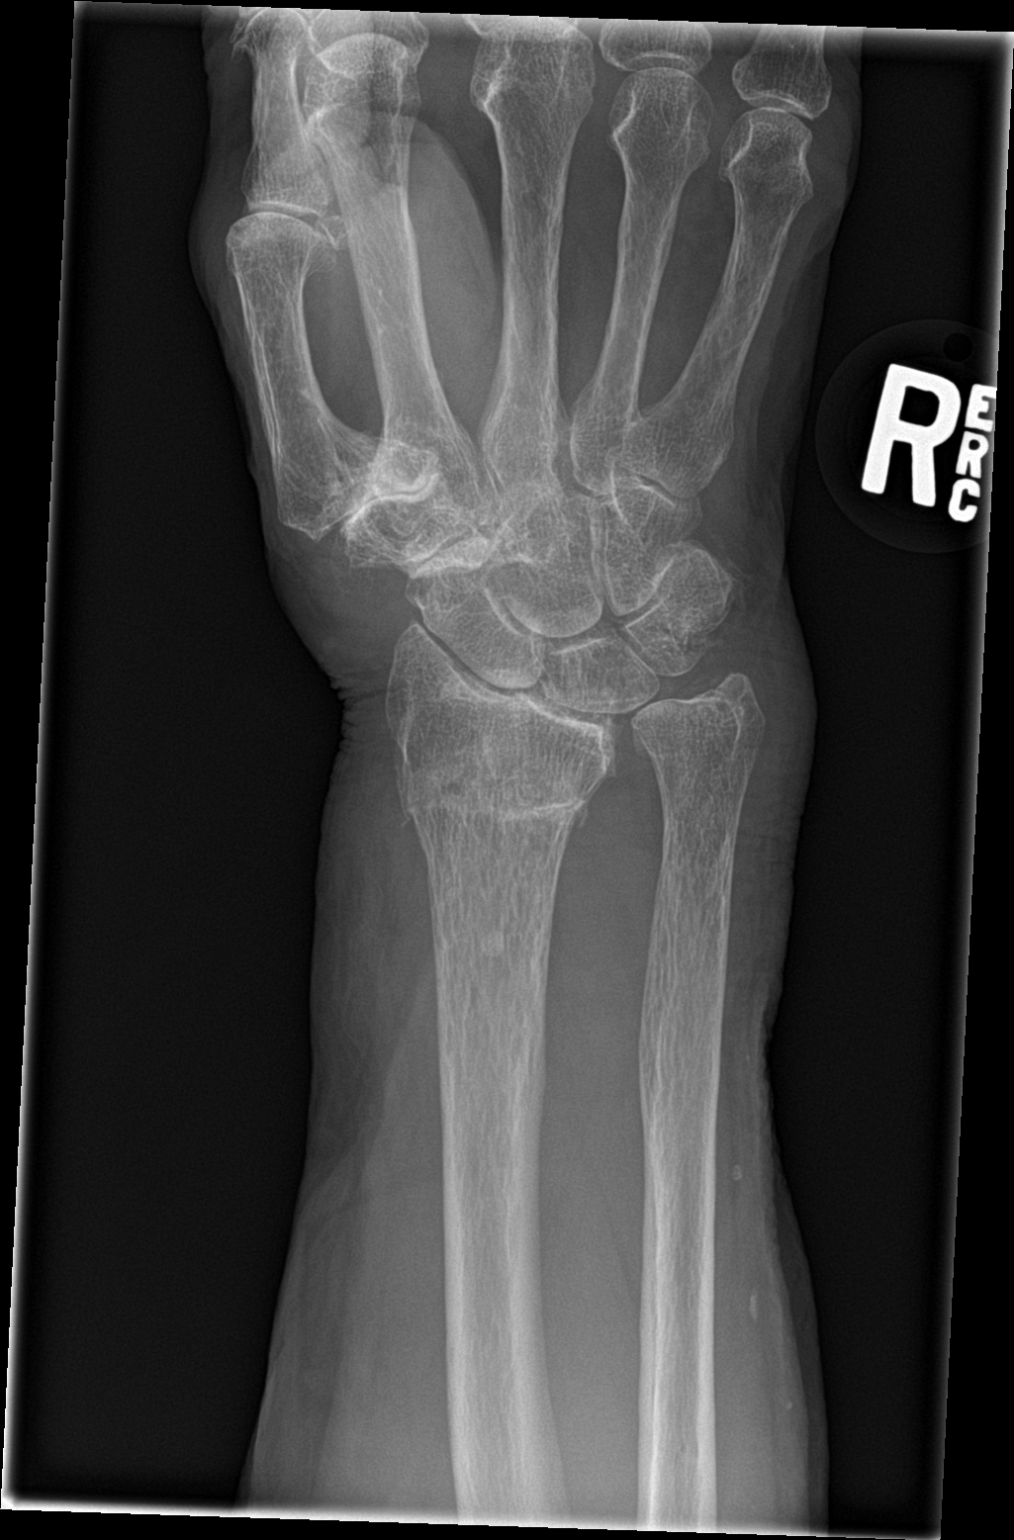
[im 2/4]
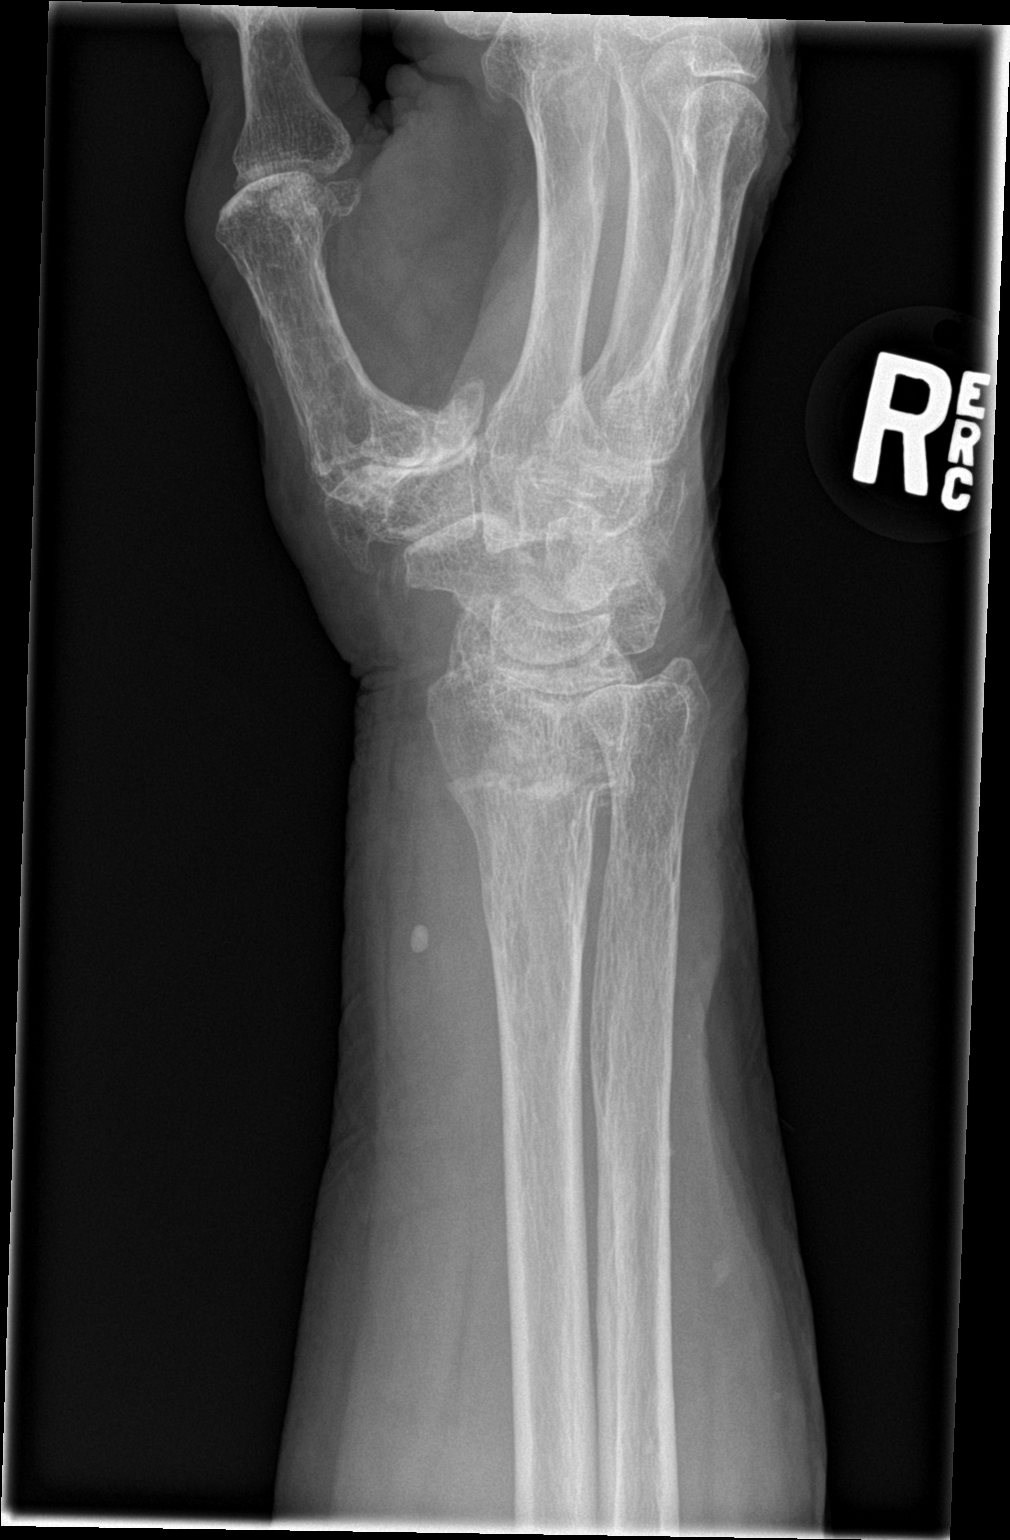
[im 3/4]
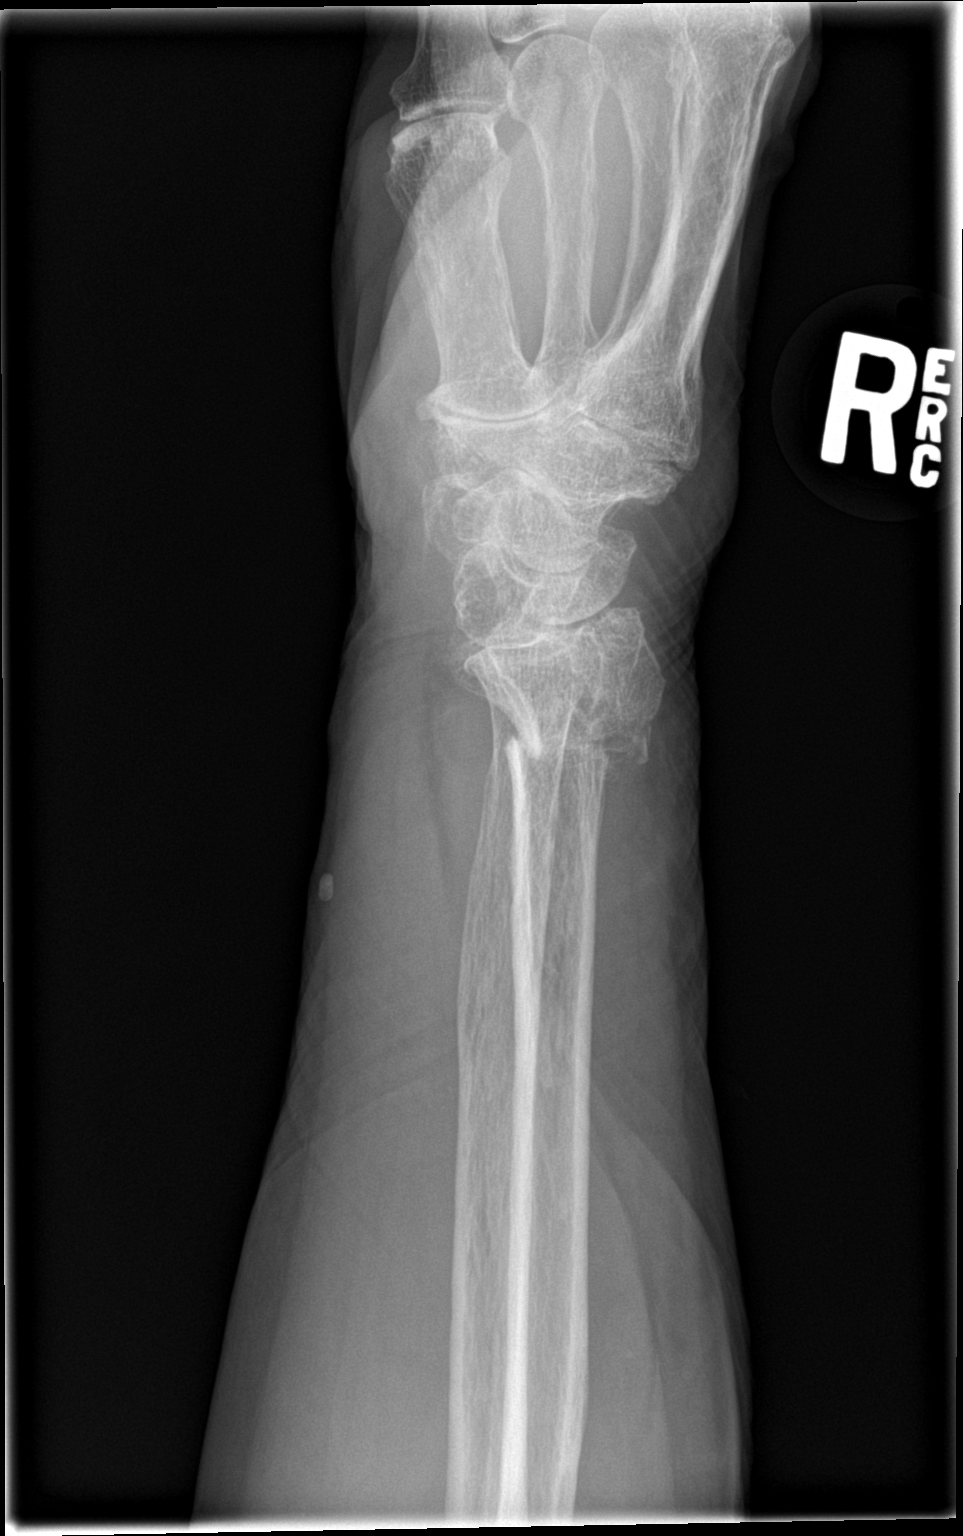
[im 4/4]
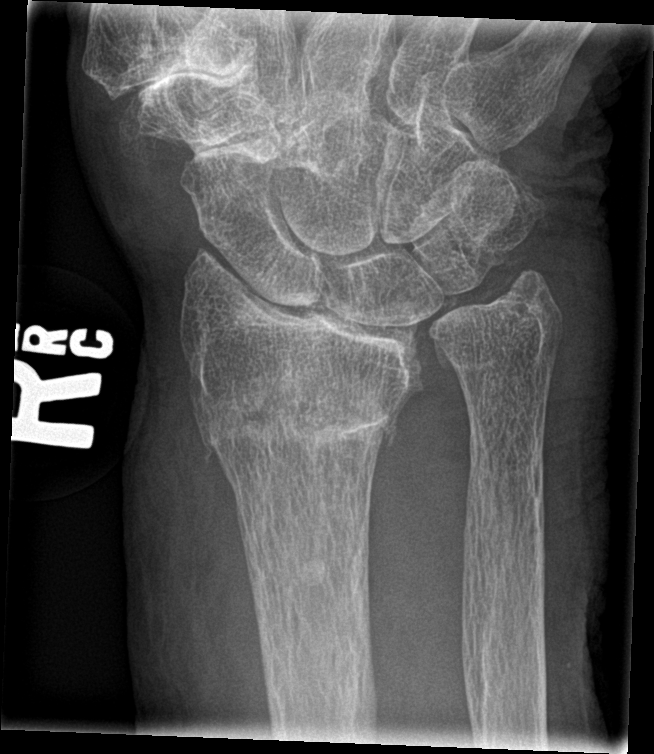

[4 of 4 positions shown; findings below may reference images not displayed]

FINDINGS: There is a mildly dorsally displaced fracture of the distal radial
metaphysis. The distal ulna appears grossly intact.

The carpal rows appear grossly intact, and demonstrate normal
alignment. Degenerative change is noted at the first carpometacarpal
joint, with mild bony remodeling. Soft tissue swelling is noted
about the wrist.
IMPRESSION: Mildly dorsally displaced fracture of the distal radial metaphysis.

## 2019-03-19 ENCOUNTER — Other Ambulatory Visit: Payer: Self-pay | Admitting: Physician Assistant

## 2019-03-19 ENCOUNTER — Other Ambulatory Visit (HOSPITAL_COMMUNITY): Payer: Self-pay | Admitting: Physician Assistant

## 2019-03-19 DIAGNOSIS — M25551 Pain in right hip: Secondary | ICD-10-CM

## 2019-03-20 ENCOUNTER — Ambulatory Visit
Admission: RE | Admit: 2019-03-20 | Discharge: 2019-03-20 | Disposition: A | Discharge status: Home / Self Care | Payer: Medicare Other | Source: Ambulatory Visit | Attending: Physician Assistant | Admitting: Physician Assistant

## 2019-03-20 ENCOUNTER — Other Ambulatory Visit: Payer: Self-pay

## 2019-03-20 DIAGNOSIS — M25551 Pain in right hip: Secondary | ICD-10-CM | POA: Diagnosis not present

## 2019-04-18 ENCOUNTER — Other Ambulatory Visit: Payer: Self-pay

## 2019-04-18 ENCOUNTER — Emergency Department
Admission: EM | Admit: 2019-04-18 | Discharge: 2019-04-18 | Disposition: A | Discharge status: Home / Self Care | Payer: Medicare Other | Attending: Emergency Medicine | Admitting: Emergency Medicine

## 2019-04-18 ENCOUNTER — Emergency Department: Payer: Medicare Other

## 2019-04-18 DIAGNOSIS — D320 Benign neoplasm of cerebral meninges: Secondary | ICD-10-CM | POA: Diagnosis not present

## 2019-04-18 DIAGNOSIS — I509 Heart failure, unspecified: Secondary | ICD-10-CM | POA: Diagnosis not present

## 2019-04-18 DIAGNOSIS — Z23 Encounter for immunization: Secondary | ICD-10-CM | POA: Diagnosis not present

## 2019-04-18 DIAGNOSIS — Z7982 Long term (current) use of aspirin: Secondary | ICD-10-CM | POA: Diagnosis not present

## 2019-04-18 DIAGNOSIS — E119 Type 2 diabetes mellitus without complications: Secondary | ICD-10-CM | POA: Diagnosis not present

## 2019-04-18 DIAGNOSIS — Z79899 Other long term (current) drug therapy: Secondary | ICD-10-CM | POA: Insufficient documentation

## 2019-04-18 DIAGNOSIS — W1839XA Other fall on same level, initial encounter: Secondary | ICD-10-CM | POA: Diagnosis not present

## 2019-04-18 DIAGNOSIS — Z7984 Long term (current) use of oral hypoglycemic drugs: Secondary | ICD-10-CM | POA: Insufficient documentation

## 2019-04-18 DIAGNOSIS — Y9389 Activity, other specified: Secondary | ICD-10-CM | POA: Diagnosis not present

## 2019-04-18 DIAGNOSIS — S51011A Laceration without foreign body of right elbow, initial encounter: Secondary | ICD-10-CM | POA: Insufficient documentation

## 2019-04-18 DIAGNOSIS — I110 Hypertensive heart disease with heart failure: Secondary | ICD-10-CM | POA: Insufficient documentation

## 2019-04-18 DIAGNOSIS — Y999 Unspecified external cause status: Secondary | ICD-10-CM | POA: Insufficient documentation

## 2019-04-18 DIAGNOSIS — Y929 Unspecified place or not applicable: Secondary | ICD-10-CM | POA: Diagnosis not present

## 2019-04-18 DIAGNOSIS — W19XXXA Unspecified fall, initial encounter: Secondary | ICD-10-CM

## 2019-04-18 LAB — URINALYSIS, ROUTINE W REFLEX MICROSCOPIC
Bacteria, UA: NONE SEEN
Bilirubin Urine: NEGATIVE
Glucose, UA: NEGATIVE mg/dL
Hgb urine dipstick: NEGATIVE
Ketones, ur: NEGATIVE mg/dL
Nitrite: NEGATIVE
Protein, ur: NEGATIVE mg/dL
Specific Gravity, Urine: 1.014 (ref 1.005–1.030)
pH: 6 (ref 5.0–8.0)

## 2019-04-18 LAB — GLUCOSE, CAPILLARY: Glucose-Capillary: 192 mg/dL — ABNORMAL HIGH (ref 70–99)

## 2019-04-18 MED ORDER — TETANUS-DIPHTH-ACELL PERTUSSIS 5-2.5-18.5 LF-MCG/0.5 IM SUSP
0.5000 mL | Freq: Once | INTRAMUSCULAR | Status: AC
  Administered 2019-04-18: 0.5 mL via INTRAMUSCULAR
  Filled 2019-04-18: qty 0.5

## 2019-04-18 NOTE — ED Notes (Signed)
Pt otf for imaging 

## 2019-04-18 NOTE — ED Triage Notes (Signed)
Pt arrives via [facility] from home for mechanical fall. Pt reports she was trying to pick up the lancent she was using to check her blood sugar and then she fell. Pt has a laceration to the right elbow that EMS has wrapped, pt states she may have hit the right side of her head, no bruising noted at this time. Pt in NAD, A&Ox4

## 2019-04-18 NOTE — Discharge Instructions (Addendum)
The dermal clips will fall off on their own in 5 to 7 days.  We use these to hold a skin tear back on.  There is no evidence of deeper laceration.  IMPRESSION: 1. No acute intracranial abnormality detected. 2. No acute cervical spine fracture. 3. Right temporal scalp swelling without evidence of underlying fracture. 4. Again noted is an inferior frontal parafalcine meningioma with slight interval increase in size from prior CT. There is right frontal lobe edema which is similar cross prior studies.

## 2019-04-18 NOTE — ED Notes (Signed)
Pt unable to sign, esignature pad down, pt and daughter voice understanding of d/c instructions

## 2019-04-18 NOTE — ED Notes (Signed)
Glucose reading 192

## 2019-04-18 NOTE — ED Notes (Signed)
Pt's daughter Wells Guiles updated on pt's status and plan of care

## 2019-04-18 NOTE — ED Provider Notes (Signed)
[facility] Emergency Department Provider Note  ____________________________________________   First MD Initiated Contact with Patient 04/18/19 1551     (approximate)  I have reviewed the triage vital signs and the nursing notes.   HISTORY  Chief Complaint Fall    HPI Kayla Gomez is a 85 y.o. female with diabetes, hypertension, CHF who presents for mechanical fall.  Patient has a laceration to the right elbow.  Patient Says That She Dropped Her device to measure her glucose. She went over to try to get it and she fell over.  She does think she hit her head. She doses have a skin tear on her right elbow with some mild pain that is constant, nothing makes better, nothing makes it worse.  She has good range of motion still at the elbow.  She denies pain anywhere else.  She is not on any blood thinners.  She denies loss of consciousness.  She denies any chest pain or shortness of breath.  Denies any urinary symptoms.  Otherwise she feels at her baseline. Felt fine prior to the fall. Ambulates with walker. Lives with family.              Past Medical History:  Diagnosis Date   Acid reflux    CHF (congestive heart failure) (HCC)    Diabetes mellitus without complication ([place])    Dysphagia    History of hiatal hernia    Hypertension    Sleep apnea     There are no active problems to display for this patient.   Past Surgical History:  Procedure Laterality Date   APPENDECTOMY     CARDIAC VALVE REPLACEMENT  2018   CARPAL TUNNEL RELEASE Left 07/07/2018   Procedure: CARPAL TUNNEL RELEASE;  Surgeon: Earnestine Leys, MD;  Location: [facility] ORS;  Service: Orthopedics;  Laterality: Left;   CHOLECYSTECTOMY      Prior to Admission medications   Medication Sig Start Date End Date Taking? Authorizing Provider  aspirin 81 MG EC tablet Take 81 mg by mouth daily.    [provider]  docusate sodium (COLACE) 100 MG capsule Take 100 mg by  mouth daily.    [provider]  ferrous gluconate (FERGON) 324 MG tablet Take 1 tablet by mouth every morning.    [provider]  furosemide (LASIX) 20 MG tablet Take 20 mg by mouth daily. May take additional dose if needed for swelling    [provider]  gabapentin (NEURONTIN) 400 MG capsule Take 1 capsule (400 mg total) by mouth 3 (three) times daily. 07/07/18   Earnestine Leys, MD  HYDROcodone-acetaminophen (NORCO) 5-325 MG tablet Take 1-2 tablets by mouth every 6 (six) hours as needed. 07/07/18   Earnestine Leys, MD  lovastatin (MEVACOR) 20 MG tablet Take 20 mg by mouth daily with supper. 12/23/17   [provider]  meloxicam (MOBIC) 15 MG tablet Take 1 tablet (15 mg total) by mouth daily. 07/07/18   Earnestine Leys, MD  omeprazole (PRILOSEC) 20 MG capsule Take 20 mg by mouth daily.    [provider]  oxybutynin (DITROPAN) 5 MG tablet Take 5 mg by mouth daily. 12/18/17   [provider]  Propylene Glycol (SYSTANE BALANCE) 0.6 % SOLN Place 1 drop into both eyes 3 (three) times daily as needed (dry eyes).    [provider]  White Petrolatum-Mineral Oil (GENTEAL PM OP) Place 1 drop into both eyes at bedtime.    [provider]  Allergies Metformin and related  History reviewed. No pertinent family history.  Social History Social History   Tobacco Use   Smoking status: Never Smoker   Smokeless tobacco: Never Used  Substance Use Topics   Alcohol use: No   Drug use: No      Review of Systems Constitutional: No fever/chills Eyes: No visual changes. ENT: No sore throat. Cardiovascular: Denies chest pain. Respiratory: Denies shortness of breath. Gastrointestinal: No abdominal pain.  No nausea, no vomiting.  No diarrhea.  No constipation. Genitourinary: Negative for dysuria. Musculoskeletal: Negative for back pain. Pain at elbow  Skin: Negative for rash. Neurological: Negative for headaches, focal weakness  or numbness. +fall  All other ROS negative ____________________________________________   PHYSICAL EXAM:  VITAL SIGNS: Blood pressure 135/77, pulse 75, temperature 98.4 F (36.9 C), temperature source Oral, resp. rate 16, height 4\' 10"  (1.473 m), weight 71.2 kg, SpO2 98 %.  Constitutional: Alert and oriented. Well appearing and in no acute distress. Pleasant female.  Eyes: Conjunctivae are normal. EOMI. Head: R temporal bump.  Nose: No congestion/rhinnorhea. Mouth/Throat: Mucous membranes are moist.   Neck: No stridor. Trachea Midline. FROM Cardiovascular: Normal rate, regular rhythm. Grossly normal heart sounds.  Good peripheral circulation. Respiratory: Normal respiratory effort.  No retractions. Lungs CTAB. Gastrointestinal: Soft and nontender. No distention. No abdominal bruits.  Musculoskeletal: No lower extremity tenderness nor edema.  No joint effusions. Skin tear on the R elbow, full rom, good strength 2+ pulses. No deep laceration into the joint.  No other pain in any other extremity.  Able to lift both legs fully off the bed.  Neurologic:  Normal speech and language. No gross focal neurologic deficits are appreciated.  Skin:  Skin is warm, dry and intact. No rash noted. Psychiatric: Mood and affect are normal. Speech and behavior are normal. GU: Deferred   ____________________________________________   LABS (all labs ordered are listed, but only abnormal results are displayed)  Labs Reviewed  URINALYSIS, ROUTINE W REFLEX MICROSCOPIC - Abnormal; Notable for the following components:      Result Value   Color, Urine YELLOW (*)    APPearance CLEAR (*)    Leukocytes,Ua TRACE (*)    All other components within normal limits  GLUCOSE, CAPILLARY - Abnormal; Notable for the following components:   Glucose-Capillary 192 (*)    All other components within normal limits  CBG MONITORING, ED   ____________________________________________   ED ECG REPORT I, Vanessa Bowman,  the attending physician, personally viewed and interpreted this ECG.  Sinus rate of 99, no ST elevation, no T wave inversion, Q waves in 3 and aVF.  QTC is prolonged at 546.  1-lead with poor baseline therefore will repeat  Repeat EKG sinus rate of 85, no ST elevation, no T wave inversion, normal intervals, PVC ____________________________________________  RADIOLOGY Robert Bellow, personally viewed and evaluated these images (plain radiographs) as part of my medical decision making, as well as reviewing the written report by the radiologist.  ED MD interpretation: Chest x-ray reviewed.  Patient does have cardiomegaly.  The patient has had this on prior x-rays.  X-rays negative for fractures   Official radiology report(s): Dg Chest 2 View  Result Date: 04/18/2019 CLINICAL DATA:  Fall. EXAM: CHEST - 2 VIEW COMPARISON:  07/05/2017 FINDINGS: Lungs are adequately inflated with mild stable elevation of the left hemidiaphragm. No evidence of airspace consolidation, effusion or pneumothorax. Mild stable cardiomegaly. Evidence of patient's cardiac valve replacement. Moderate degenerative changes of the spine  with possible loss of height of at least 1 midthoracic vertebral body age indeterminate. IMPRESSION: No acute cardiopulmonary disease. Mild cardiomegaly. Possible midthoracic spine compression fracture age indeterminate. Electronically Signed   By: Marin Olp M.D.   On: 04/18/2019 16:48   Dg Elbow 2 Views Right  Result Date: 04/18/2019 CLINICAL DATA:  Fall.  Right elbow pain. EXAM: RIGHT ELBOW - 2 VIEW COMPARISON:  None. FINDINGS: Mild degenerative change over the elbow. No evidence of acute fracture or dislocation. IMPRESSION: No acute findings. Electronically Signed   By: Marin Olp M.D.   On: 04/18/2019 16:49   Dg Forearm Right  Result Date: 04/18/2019 CLINICAL DATA:  Fall with right forearm pain. EXAM: RIGHT FOREARM - 2 VIEW COMPARISON:  01/20/2018 FINDINGS: Mild degenerate change at  the right elbow and moderate degenerative change over the right wrist. Findings compatible with patient's old distal right radial fracture. No definite acute fracture. IMPRESSION: No acute findings. Old distal right radial fracture. Electronically Signed   By: Marin Olp M.D.   On: 04/18/2019 16:51   Ct Head Wo Contrast  Result Date: 04/18/2019 CLINICAL DATA:  Acute pain due to trauma EXAM: CT HEAD WITHOUT CONTRAST CT CERVICAL SPINE WITHOUT CONTRAST TECHNIQUE: Multidetector CT imaging of the head and cervical spine was performed following the standard protocol without intravenous contrast. Multiplanar CT image reconstructions of the cervical spine were also generated. COMPARISON:  None. 01/20/2018. FINDINGS: CT HEAD FINDINGS Brain: There is no acute intracranial hemorrhage. Again noted is a right frontal parafalcine meningioma with adjacent right frontal lobe edema. There is no significant mass effect. There is age related volume loss and chronic microvascular ischemic changes. Vascular: No hyperdense vessel or unexpected calcification. Skull: Normal. Negative for fracture or focal lesion. There is right temporal scalp swelling without evidence of underlying fracture. Sinuses/Orbits: No acute finding. Other: None. CT CERVICAL SPINE FINDINGS Alignment: There is straightening of the normal cervical lordotic curvature. Skull base and vertebrae: No acute fracture. No primary bone lesion or focal pathologic process. Soft tissues and spinal canal: No prevertebral fluid or swelling. No visible canal hematoma. Disc levels: There is multilevel disc height loss throughout the cervical spine greatest at the C4-C5, C5-C6, and C6-C7 levels. Upper chest: Negative. Other: None IMPRESSION: 1. No acute intracranial abnormality detected. 2. No acute cervical spine fracture. 3. Right temporal scalp swelling without evidence of underlying fracture. 4. Again noted is an inferior frontal parafalcine meningioma with slight interval  increase in size from prior CT. There is right frontal lobe edema which is similar cross prior studies. Electronically Signed   By: Constance Holster M.D.   On: 04/18/2019 17:05   Ct Cervical Spine Wo Contrast  Result Date: 04/18/2019 CLINICAL DATA:  Acute pain due to trauma EXAM: CT HEAD WITHOUT CONTRAST CT CERVICAL SPINE WITHOUT CONTRAST TECHNIQUE: Multidetector CT imaging of the head and cervical spine was performed following the standard protocol without intravenous contrast. Multiplanar CT image reconstructions of the cervical spine were also generated. COMPARISON:  None. 01/20/2018. FINDINGS: CT HEAD FINDINGS Brain: There is no acute intracranial hemorrhage. Again noted is a right frontal parafalcine meningioma with adjacent right frontal lobe edema. There is no significant mass effect. There is age related volume loss and chronic microvascular ischemic changes. Vascular: No hyperdense vessel or unexpected calcification. Skull: Normal. Negative for fracture or focal lesion. There is right temporal scalp swelling without evidence of underlying fracture. Sinuses/Orbits: No acute finding. Other: None. CT CERVICAL SPINE FINDINGS Alignment: There is straightening of the  normal cervical lordotic curvature. Skull base and vertebrae: No acute fracture. No primary bone lesion or focal pathologic process. Soft tissues and spinal canal: No prevertebral fluid or swelling. No visible canal hematoma. Disc levels: There is multilevel disc height loss throughout the cervical spine greatest at the C4-C5, C5-C6, and C6-C7 levels. Upper chest: Negative. Other: None IMPRESSION: 1. No acute intracranial abnormality detected. 2. No acute cervical spine fracture. 3. Right temporal scalp swelling without evidence of underlying fracture. 4. Again noted is an inferior frontal parafalcine meningioma with slight interval increase in size from prior CT. There is right frontal lobe edema which is similar cross prior studies.  Electronically Signed   By: Constance Holster M.D.   On: 04/18/2019 17:05    ____________________________________________   PROCEDURES  Procedure(s) performed (including Critical Care):  Marland KitchenMarland KitchenLaceration Repair  Date/Time: 04/18/2019 5:59 PM Performed by: Vanessa [place], MD Authorized by: Vanessa Welaka, MD   Consent:    Consent obtained:  Verbal   Consent given by:  Patient   Risks discussed:  Infection, need for additional repair, nerve damage, pain, poor cosmetic result and poor wound healing   Alternatives discussed:  No treatment Anesthesia (see MAR for exact dosages):    Anesthesia method:  None Laceration details:    Location:  Shoulder/arm   Shoulder/arm location:  R elbow   Length (cm):  5   Depth (mm):  1 Exploration:    Contaminated: no   Treatment:    Area cleansed with:  Saline   Amount of cleaning:  Standard   Visualized foreign bodies/material removed: no   Skin repair:    Repair method: 3 dermaclips  Approximation:    Approximation:  Close Post-procedure details:    Dressing:  Open (no dressing)   Patient tolerance of procedure:  Tolerated well, no immediate complications     ____________________________________________   INITIAL IMPRESSION / ASSESSMENT AND PLAN / ED COURSE  Landree JUPITER BOYS was evaluated in Emergency Department on 04/18/2019 for the symptoms described in the history of present illness. She was evaluated in the context of the global COVID-19 pandemic, which necessitated consideration that the patient might be at risk for infection with the SARS-CoV-2 virus that causes COVID-19. Institutional protocols and algorithms that pertain to the evaluation of patients at risk for COVID-19 are in a state of rapid change based on information released by regulatory bodies including the CDC and federal and state organizations. These policies and algorithms were followed during the patient's care in the ED.    Very pleasant well-appearing 85 year old with  mechanical fall.  Patient denies any symptoms to suggest anemia, UTI, electrolyte abnormalities, ACS, PE.  Will get CT scan to evaluate for epidural or subdural hematoma.  Will get CT cervical to evaluate for cervical fracture.  Will check glucose to evaluate for hypoglycemia.  Will get x-rays of the elbow.  Patient has a skin tear on the right elbow.  No evidence of deeper laceration that would involve the joint.  Will repair skin tear with dermal clips.  Patient denies any abdominal pain or thoracic pain to suggest rib fractures or abdominal injury.  Patient is able to move both of her legs therefore low suspicion for hip fracture.   Glucose 192 CT scan negative  Chest xray negative Xray negative of elbow.    Reevaluated patient continues to do well.  Applied derma clips onto the skin laceration.  Patient is aware of the menigioma that was on CT scan.  Patient  feels comfortable with discharge back to home.   I discussed the provisional nature of ED diagnosis, the treatment so far, the ongoing plan of care, follow up appointments and return precautions with the patient and any family or support people present. They expressed understanding and agreed with the plan, discharged home.     ____________________________________________   FINAL CLINICAL IMPRESSION(S) / ED DIAGNOSES   Final diagnoses:  Fall, initial encounter  Skin tear of right elbow without complication, initial encounter      MEDICATIONS GIVEN DURING THIS VISIT:  Medications  Tdap (BOOSTRIX) injection 0.5 mL (0.5 mLs Intramuscular Given 04/18/19 1700)     ED Discharge Orders    None       Note:  This document was prepared using Dragon voice recognition software and may include unintentional dictation errors.   Vanessa [place], MD 04/18/19 [phone]

## 2019-05-07 ENCOUNTER — Other Ambulatory Visit (HOSPITAL_COMMUNITY): Payer: Self-pay | Admitting: Podiatry

## 2019-05-07 ENCOUNTER — Other Ambulatory Visit: Payer: Self-pay

## 2019-05-07 ENCOUNTER — Other Ambulatory Visit: Payer: Self-pay | Admitting: Podiatry

## 2019-05-07 ENCOUNTER — Other Ambulatory Visit (HOSPITAL_COMMUNITY): Payer: Self-pay | Admitting: Internal Medicine

## 2019-05-07 ENCOUNTER — Ambulatory Visit
Admission: RE | Admit: 2019-05-07 | Discharge: 2019-05-07 | Disposition: A | Discharge status: Home / Self Care | Payer: Medicare Other | Source: Ambulatory Visit | Attending: Podiatry | Admitting: Podiatry

## 2019-05-07 DIAGNOSIS — R600 Localized edema: Secondary | ICD-10-CM

## 2019-05-07 DIAGNOSIS — I872 Venous insufficiency (chronic) (peripheral): Secondary | ICD-10-CM | POA: Diagnosis present

## 2019-05-13 ENCOUNTER — Other Ambulatory Visit: Payer: Self-pay | Admitting: Internal Medicine

## 2019-05-13 DIAGNOSIS — R319 Hematuria, unspecified: Secondary | ICD-10-CM

## 2019-05-14 ENCOUNTER — Ambulatory Visit
Admission: RE | Admit: 2019-05-14 | Discharge: 2019-05-14 | Disposition: A | Discharge status: Home / Self Care | Payer: Medicare Other | Source: Ambulatory Visit | Attending: Internal Medicine | Admitting: Internal Medicine

## 2019-05-14 ENCOUNTER — Other Ambulatory Visit: Payer: Self-pay

## 2019-05-14 ENCOUNTER — Ambulatory Visit: Payer: Medicare Other

## 2019-05-14 DIAGNOSIS — R319 Hematuria, unspecified: Secondary | ICD-10-CM | POA: Insufficient documentation

## 2019-05-14 MED ORDER — IOHEXOL 300 MG/ML  SOLN
75.0000 mL | Freq: Once | INTRAMUSCULAR | Status: AC | PRN
  Administered 2019-05-14: 75 mL via INTRAVENOUS

## 2019-08-02 DEATH — deceased

## 2020-06-09 IMAGING — CR RIGHT ELBOW - 2 VIEW
2 series · 2 of 2 positions shown · non-contrast
Comparison: None.

CLINICAL DATA: Fall.  Right elbow pain.

EXAM:
RIGHT ELBOW - 2 VIEW

[elbow ap]
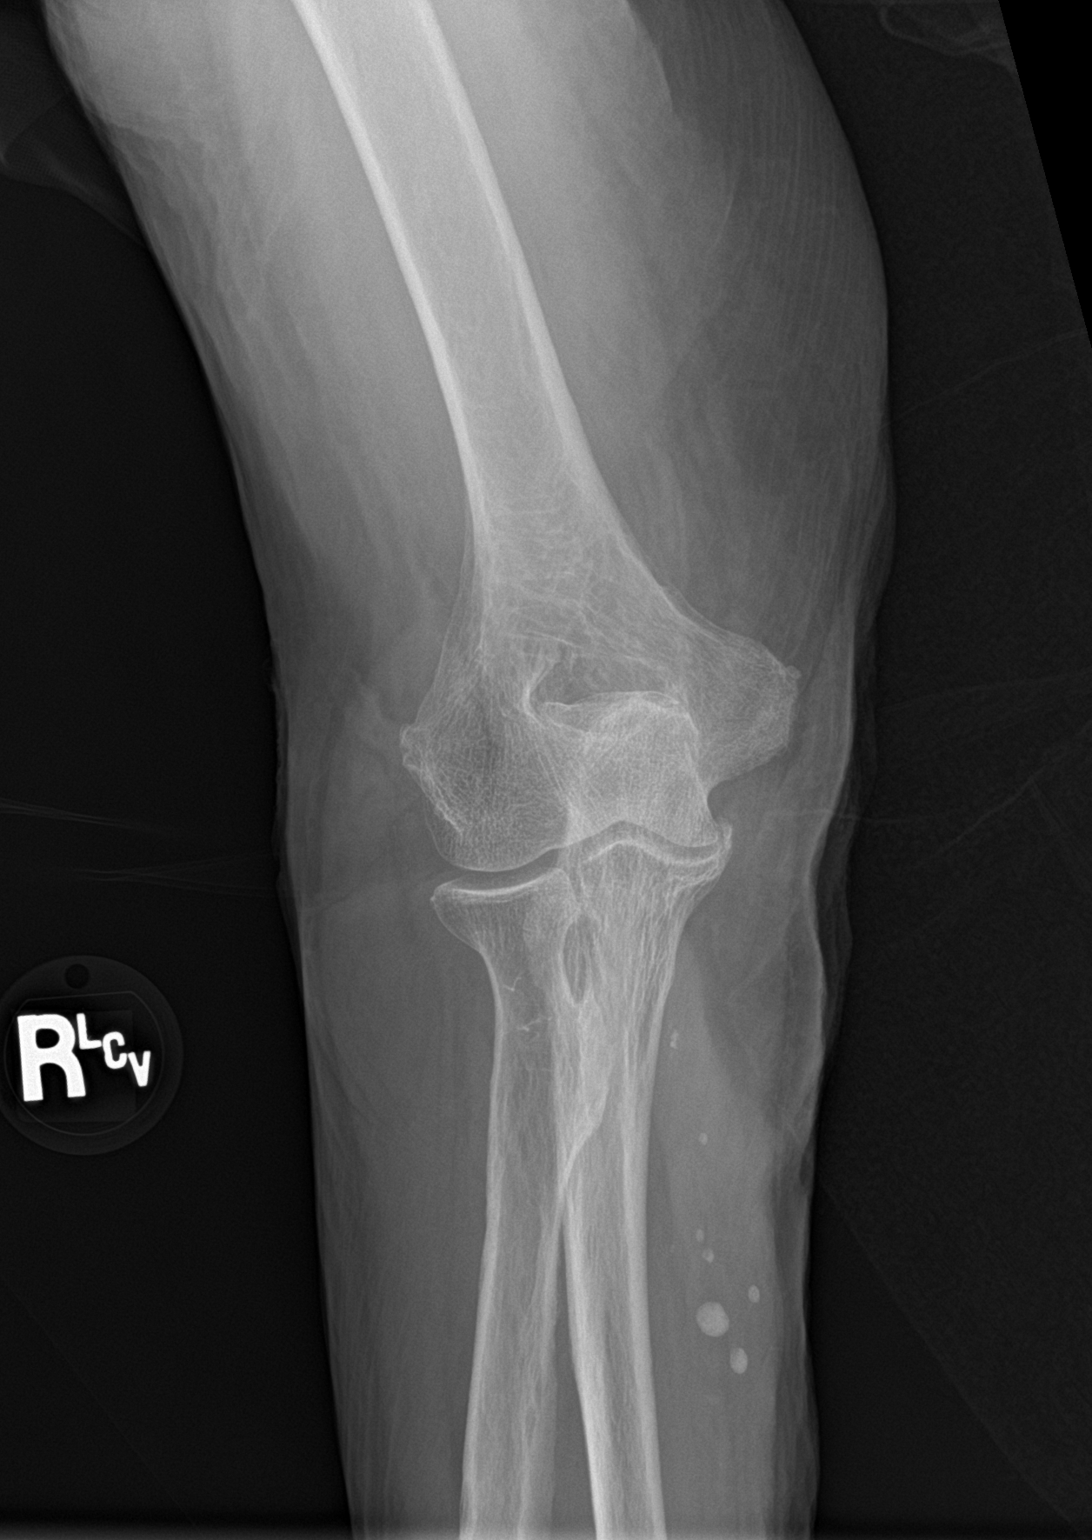

[elbow lat]
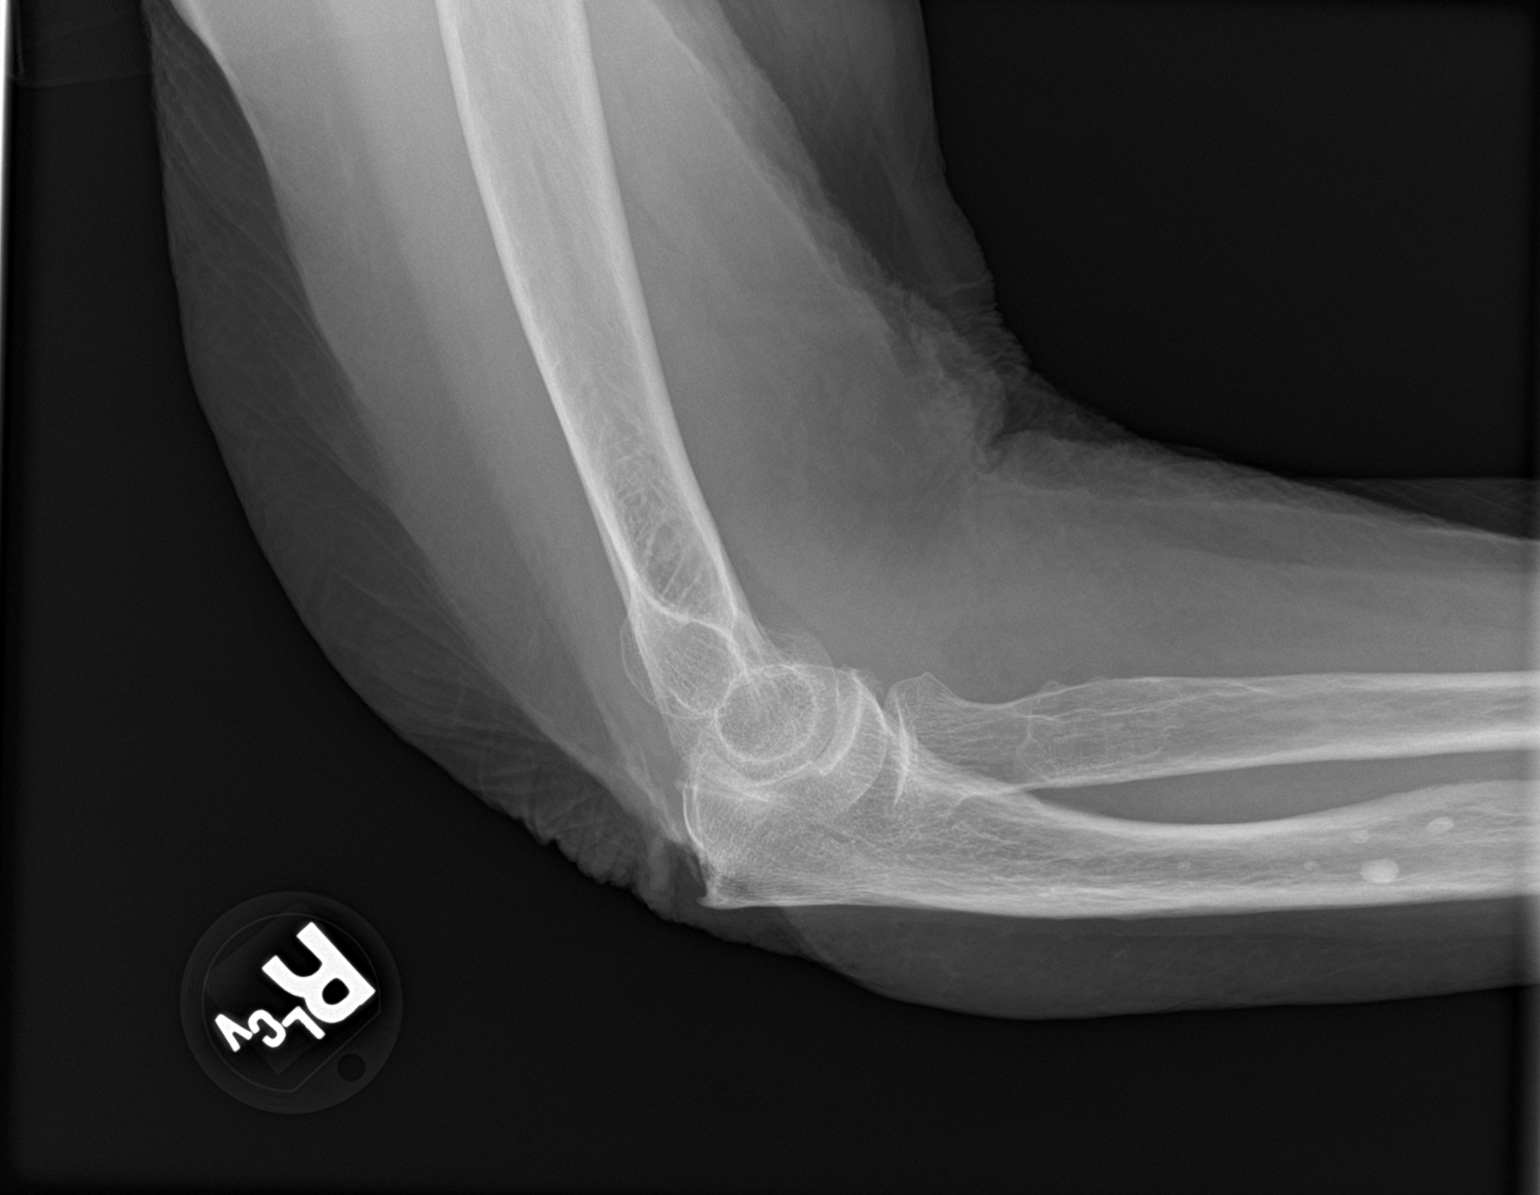

[2 of 2 positions shown; findings below may reference images not displayed]

FINDINGS: Mild degenerative change over the elbow. No evidence of acute
fracture or dislocation.
IMPRESSION: No acute findings.
